# Patient Record
Sex: Female | Born: 1985 | Race: Black or African American | Hispanic: No | Marital: Single | Smoking: Former smoker
Health system: Southern US, Community
[De-identification: ages and names within clinical notes are randomized; demographics above are authoritative.]

## PROBLEM LIST (undated history)

## (undated) DIAGNOSIS — F32A Depression, unspecified: Secondary | ICD-10-CM

## (undated) DIAGNOSIS — G039 Meningitis, unspecified: Secondary | ICD-10-CM

## (undated) DIAGNOSIS — F419 Anxiety disorder, unspecified: Secondary | ICD-10-CM

## (undated) HISTORY — DX: Meningitis, unspecified: G03.9

## (undated) HISTORY — PX: OTHER SURGICAL HISTORY: SHX169

## (undated) HISTORY — PX: COLONOSCOPY: SHX174

## (undated) HISTORY — PX: PARTIAL COLECTOMY: SHX5273

---

## 2010-08-20 ENCOUNTER — Ambulatory Visit: Payer: Self-pay | Admitting: Diagnostic Radiology

## 2010-08-20 ENCOUNTER — Emergency Department (HOSPITAL_BASED_OUTPATIENT_CLINIC_OR_DEPARTMENT_OTHER): Admission: EM | Admit: 2010-08-20 | Discharge: 2010-08-20 | Payer: Self-pay | Admitting: Emergency Medicine

## 2011-01-20 LAB — PREGNANCY, URINE: Preg Test, Ur: NEGATIVE

## 2011-01-20 LAB — URINALYSIS, ROUTINE W REFLEX MICROSCOPIC
Ketones, ur: 15 mg/dL — AB
Nitrite: NEGATIVE
Protein, ur: NEGATIVE mg/dL

## 2012-02-24 ENCOUNTER — Emergency Department (HOSPITAL_BASED_OUTPATIENT_CLINIC_OR_DEPARTMENT_OTHER)
Admission: EM | Admit: 2012-02-24 | Discharge: 2012-02-24 | Disposition: A | Discharge status: Home / Self Care | Payer: Self-pay | Attending: Emergency Medicine | Admitting: Emergency Medicine

## 2012-02-24 ENCOUNTER — Encounter (HOSPITAL_BASED_OUTPATIENT_CLINIC_OR_DEPARTMENT_OTHER): Payer: Self-pay

## 2012-02-24 DIAGNOSIS — H5710 Ocular pain, unspecified eye: Secondary | ICD-10-CM | POA: Insufficient documentation

## 2012-02-24 DIAGNOSIS — H109 Unspecified conjunctivitis: Secondary | ICD-10-CM | POA: Insufficient documentation

## 2012-02-24 MED ORDER — TETRACAINE HCL 0.5 % OP SOLN
1.0000 [drp] | Freq: Once | OPHTHALMIC | Status: AC
  Administered 2012-02-24: 1 [drp] via OPHTHALMIC
  Filled 2012-02-24: qty 2

## 2012-02-24 MED ORDER — SULFACETAMIDE SODIUM 10 % OP OINT: TOPICAL_OINTMENT | OPHTHALMIC | Status: AC

## 2012-02-24 MED ORDER — SULFACETAMIDE SODIUM 10 % OP SOLN
1.0000 [drp] | OPHTHALMIC | Status: DC
  Administered 2012-02-24 (×2): 1 [drp] via OPHTHALMIC
  Filled 2012-02-24: qty 15

## 2012-02-24 MED ORDER — FLUORESCEIN SODIUM 1 MG OP STRP
1.0000 | ORAL_STRIP | Freq: Once | OPHTHALMIC | Status: DC
  Filled 2012-02-24: qty 1

## 2012-02-24 NOTE — ED Notes (Signed)
Pt presents with red conjunctiva in the R eye.  Pt states that she has not injured her eye in anyway, continuously watering

## 2012-02-25 NOTE — ED Provider Notes (Signed)
History     CSN: [id number]  Arrival date & time 02/24/12  2030   First MD Initiated Contact with Patient 02/24/12 2227      Chief Complaint  Patient presents with  . Conjunctivitis    (Consider location/radiation/quality/duration/timing/severity/associated sxs/prior treatment) HPI Comments: Pt is a 26 year old woman who wears contact lenses.  She says that her right eye became very irritated today.  She says she had similar but lesser symptoms last week and had to take her lenses out for a couple of days.  She has right eye pain and excessive tearing.  There has been no fever and no purulent discharge.  Patient is a 26 y.o. female presenting with conjunctivitis. The history is provided by the patient. No language interpreter was used.  Conjunctivitis  The current episode started today. Episode frequency: Had somewhat similar episode last week. The problem has been unchanged. The problem is severe. The symptoms are relieved by nothing. The symptoms are aggravated by nothing. Associated symptoms include photophobia, eye pain and eye redness. Pertinent negatives include no fever. The eye pain is severe. There is pain in the right eye. The eye pain is not associated with movement. The eyelid exhibits no abnormality. She has received no recent medical care.    History reviewed. No pertinent past medical history.  History reviewed. No pertinent past surgical history.  History reviewed. No pertinent family history.  History  Substance Use Topics  . Smoking status: Current Everyday Smoker -- 0.5 packs/day    Types: Cigarettes  . Smokeless tobacco: Not on file  . Alcohol Use: Yes    OB History    Grav Para Term Preterm Abortions TAB SAB Ect Mult Living                  Review of Systems  Constitutional: Negative for fever.  HENT: Negative.   Eyes: Positive for photophobia, pain and redness.  Respiratory: Negative.   Cardiovascular: Negative.   Gastrointestinal: Negative.     Genitourinary: Negative.   Musculoskeletal: Negative.   Skin: Negative.   Neurological: Negative.   Psychiatric/Behavioral: Negative.     Allergies  Review of patient's allergies indicates no known allergies.  Home Medications   Current Outpatient Rx  Name Route Sig Dispense Refill  . ASPIRIN 325 MG PO TABS Oral Take 325 mg by mouth daily. Patient used this medication for her headache as well.    . BC HEADACHE POWDER PO Oral Take 1 packet by mouth daily as needed. Patient used this medication for her headache.    . SULFACETAMIDE SODIUM 10 % OP OINT  Put one drop in right eye every 4 hours while awake for 4 days. 3.5 g 0    BP 127/89  Pulse 89  Temp(Src) 98.9 F (37.2 C) (Oral)  Resp 16  Ht 5\' 5"  (1.651 m)  Wt 150 lb (68.04 kg)  BMI 24.96 kg/m2  SpO2 98%  LMP 02/13/2012  Physical Exam  Nursing note and vitals reviewed. Constitutional: She is oriented to person, place, and time. She appears well-developed and well-nourished.       Resting in darkened room, mild distress complaining of right eye pain and excessive tearing.  HENT:  Head: Normocephalic and atraumatic.  Right Ear: External ear normal.  Left Ear: External ear normal.  Mouth/Throat: Oropharynx is clear and moist.  Eyes: EOM are normal. Pupils are equal, round, and reactive to light.       She has redness of the right  eye bulbar and palpebral conjunctivae.  There is no purulent discharge and no eyelash matting.  On slit lamp exam there is mild fine corneal stippling but no corneal abrasion or ulcer.  Anterior chambers are clear.  Fundus not well seen due to pupillary miosis.  No foreign body seen.    Neck: Normal range of motion. Neck supple.  Lymphadenopathy:    She has no cervical adenopathy.  Neurological: She is alert and oriented to person, place, and time.       No sensory or motor deficit.  Skin: Skin is warm and dry.  Psychiatric: She has a normal mood and affect. Her behavior is normal.    ED  Course  Procedures (including critical care time)  Advised to used Bleph-10 eye drops q4h for the next 4 days, and to leave contact lenses out.  1. Conjunctivitis of right eye        Carleene Cooper III, MD 02/25/12 1250

## 2012-05-20 ENCOUNTER — Emergency Department (HOSPITAL_BASED_OUTPATIENT_CLINIC_OR_DEPARTMENT_OTHER)
Admission: EM | Admit: 2012-05-20 | Discharge: 2012-05-20 | Disposition: A | Discharge status: Home / Self Care | Payer: Self-pay | Attending: Emergency Medicine | Admitting: Emergency Medicine

## 2012-05-20 ENCOUNTER — Emergency Department (HOSPITAL_BASED_OUTPATIENT_CLINIC_OR_DEPARTMENT_OTHER): Payer: Self-pay

## 2012-05-20 ENCOUNTER — Encounter (HOSPITAL_BASED_OUTPATIENT_CLINIC_OR_DEPARTMENT_OTHER): Payer: Self-pay | Admitting: Emergency Medicine

## 2012-05-20 DIAGNOSIS — S8392XA Sprain of unspecified site of left knee, initial encounter: Secondary | ICD-10-CM

## 2012-05-20 DIAGNOSIS — S022XXA Fracture of nasal bones, initial encounter for closed fracture: Secondary | ICD-10-CM | POA: Insufficient documentation

## 2012-05-20 DIAGNOSIS — F17200 Nicotine dependence, unspecified, uncomplicated: Secondary | ICD-10-CM | POA: Insufficient documentation

## 2012-05-20 DIAGNOSIS — IMO0002 Reserved for concepts with insufficient information to code with codable children: Secondary | ICD-10-CM | POA: Insufficient documentation

## 2012-05-20 DIAGNOSIS — Y92009 Unspecified place in unspecified non-institutional (private) residence as the place of occurrence of the external cause: Secondary | ICD-10-CM | POA: Insufficient documentation

## 2012-05-20 MED ORDER — HYDROCODONE-ACETAMINOPHEN 5-500 MG PO TABS: 1.0000 | ORAL_TABLET | Freq: Four times a day (QID) | ORAL | Status: AC | PRN
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
## 2012-08-07 ENCOUNTER — Encounter (HOSPITAL_BASED_OUTPATIENT_CLINIC_OR_DEPARTMENT_OTHER): Payer: Self-pay | Admitting: *Deleted

## 2012-08-07 ENCOUNTER — Emergency Department (HOSPITAL_BASED_OUTPATIENT_CLINIC_OR_DEPARTMENT_OTHER): Payer: Self-pay

## 2012-08-07 ENCOUNTER — Emergency Department (HOSPITAL_BASED_OUTPATIENT_CLINIC_OR_DEPARTMENT_OTHER)
Admission: EM | Admit: 2012-08-07 | Discharge: 2012-08-07 | Disposition: A | Discharge status: Home / Self Care | Payer: Self-pay | Attending: Emergency Medicine | Admitting: Emergency Medicine

## 2012-08-07 DIAGNOSIS — R509 Fever, unspecified: Secondary | ICD-10-CM | POA: Insufficient documentation

## 2012-08-07 DIAGNOSIS — J40 Bronchitis, not specified as acute or chronic: Secondary | ICD-10-CM | POA: Insufficient documentation

## 2012-08-07 MED ORDER — AZITHROMYCIN 250 MG PO TABS: ORAL_TABLET | ORAL | Status: DC

## 2012-08-07 NOTE — ED Notes (Signed)
Fever, chills, diarrhea, aching all over, cough with brown sputum, sore throat and watery eyes. Feels like she has the flu.

## 2012-08-07 NOTE — ED Provider Notes (Signed)
History     CSN: [id number]  Arrival date & time 08/07/12  2029   First MD Initiated Contact with Patient 08/07/12 2110      Chief Complaint  Patient presents with  . Fever    (Consider location/radiation/quality/duration/timing/severity/associated sxs/prior treatment) Patient is a 26 y.o. female presenting with cough. The history is provided by the patient. No language interpreter was used.  Cough This is a new problem. The current episode started more than 2 days ago. The problem occurs constantly. The problem has been gradually worsening. The cough is productive of sputum. The maximum temperature recorded prior to her arrival was 100 to 100.9 F. The fever has been present for 1 to 2 days. Associated symptoms include rhinorrhea and sore throat. The treatment provided moderate relief. Her past medical history does not include pneumonia.   Pt complains of  Feeling like she has the flu.  Pt reports coughing up brown phelgm.   Sinus congestion and pressure History reviewed. No pertinent past medical history.  History reviewed. No pertinent past surgical history.  No family history on file.  History  Substance Use Topics  . Smoking status: Current Every Day Smoker -- 0.5 packs/day    Types: Cigarettes  . Smokeless tobacco: Not on file  . Alcohol Use: Yes     occasional    OB History    Grav Para Term Preterm Abortions TAB SAB Ect Mult Living                  Review of Systems  HENT: Positive for sore throat and rhinorrhea.   Respiratory: Positive for cough.   All other systems reviewed and are negative.    Allergies  Review of patient's allergies indicates no known allergies.  Home Medications   Current Outpatient Rx  Name Route Sig Dispense Refill  . BC HEADACHE POWDER PO Oral Take 1 packet by mouth daily as needed. Patient used this medication for her headache.      BP 111/80  Pulse 88  Temp 98.6 F (37 C) (Oral)  Resp 20  SpO2 99%  Physical Exam    Nursing note and vitals reviewed. Constitutional: She appears well-developed and well-nourished.  HENT:  Head: Normocephalic and atraumatic.  Right Ear: External ear normal.  Left Ear: External ear normal.  Nose: Nose normal.  Mouth/Throat: Oropharynx is clear and moist.  Eyes: Conjunctivae normal and EOM are normal. Pupils are equal, round, and reactive to light.  Neck: Normal range of motion. Neck supple.  Cardiovascular: Normal rate and normal heart sounds.   Pulmonary/Chest: Effort normal.  Abdominal: Soft.  Musculoskeletal: Normal range of motion.  Neurological: She is alert.  Skin: Skin is warm.  Psychiatric: She has a normal mood and affect.    ED Course  Procedures (including critical care time)  Labs Reviewed - No data to display No results found.   1. Bronchitis       MDM  No pneumonia.   I will treat pt for bronchitis.  Pt given rx for zithromax       Elson Areas, [place] 08/07/12 2211  Lonia Skinner [place], [place] 08/07/12 2213

## 2012-08-07 NOTE — ED Notes (Signed)
Pt c/o congestion, sore throat/neck. Denies n/v.

## 2012-08-08 NOTE — ED Provider Notes (Signed)
Medical screening examination/treatment/procedure(s) were performed by non-physician practitioner and as supervising physician I was immediately available for consultation/collaboration.  Charles B. Sheldon, MD 08/08/12 2334 

## 2012-10-30 ENCOUNTER — Inpatient Hospital Stay (HOSPITAL_BASED_OUTPATIENT_CLINIC_OR_DEPARTMENT_OTHER)
Admission: EM | Admit: 2012-10-30 | Discharge: 2012-11-04 | DRG: 075 | Disposition: A | Discharge status: Other Acute Inpatient Hospital | Payer: Self-pay | Attending: Family Medicine | Admitting: Family Medicine

## 2012-10-30 ENCOUNTER — Encounter (HOSPITAL_BASED_OUTPATIENT_CLINIC_OR_DEPARTMENT_OTHER): Payer: Self-pay | Admitting: *Deleted

## 2012-10-30 ENCOUNTER — Emergency Department (HOSPITAL_BASED_OUTPATIENT_CLINIC_OR_DEPARTMENT_OTHER): Payer: Self-pay

## 2012-10-30 DIAGNOSIS — M545 Low back pain, unspecified: Secondary | ICD-10-CM

## 2012-10-30 DIAGNOSIS — R059 Cough, unspecified: Secondary | ICD-10-CM | POA: Diagnosis present

## 2012-10-30 DIAGNOSIS — R509 Fever, unspecified: Secondary | ICD-10-CM | POA: Diagnosis present

## 2012-10-30 DIAGNOSIS — R5381 Other malaise: Secondary | ICD-10-CM | POA: Diagnosis present

## 2012-10-30 DIAGNOSIS — Z3202 Encounter for pregnancy test, result negative: Secondary | ICD-10-CM

## 2012-10-30 DIAGNOSIS — N12 Tubulo-interstitial nephritis, not specified as acute or chronic: Secondary | ICD-10-CM

## 2012-10-30 DIAGNOSIS — A879 Viral meningitis, unspecified: Secondary | ICD-10-CM

## 2012-10-30 DIAGNOSIS — F17200 Nicotine dependence, unspecified, uncomplicated: Secondary | ICD-10-CM | POA: Diagnosis present

## 2012-10-30 DIAGNOSIS — R5383 Other fatigue: Secondary | ICD-10-CM | POA: Diagnosis present

## 2012-10-30 DIAGNOSIS — B003 Herpesviral meningitis: Principal | ICD-10-CM

## 2012-10-30 DIAGNOSIS — N390 Urinary tract infection, site not specified: Secondary | ICD-10-CM

## 2012-10-30 DIAGNOSIS — R05 Cough: Secondary | ICD-10-CM | POA: Diagnosis present

## 2012-10-30 LAB — COMPREHENSIVE METABOLIC PANEL
ALT: 12 U/L (ref 0–35)
AST: 19 U/L (ref 0–37)
Albumin: 4 g/dL (ref 3.5–5.2)
Alkaline Phosphatase: 70 U/L (ref 39–117)
BUN: 8 mg/dL (ref 6–23)
Potassium: 4.2 mEq/L (ref 3.5–5.1)
Sodium: 137 mEq/L (ref 135–145)
Total Protein: 8 g/dL (ref 6.0–8.3)

## 2012-10-30 LAB — CBC WITH DIFFERENTIAL/PLATELET
Basophils Absolute: 0 10*3/uL (ref 0.0–0.1)
Eosinophils Absolute: 0.1 10*3/uL (ref 0.0–0.7)
Lymphocytes Relative: 19 % (ref 12–46)
MCHC: 34.4 g/dL (ref 30.0–36.0)
Monocytes Relative: 5 % (ref 3–12)
Neutrophils Relative %: 75 % (ref 43–77)
Platelets: 362 10*3/uL (ref 150–400)
RDW: 13.3 % (ref 11.5–15.5)
WBC: 11.7 10*3/uL — ABNORMAL HIGH (ref 4.0–10.5)

## 2012-10-30 LAB — URINALYSIS, ROUTINE W REFLEX MICROSCOPIC
Bilirubin Urine: NEGATIVE
Glucose, UA: NEGATIVE mg/dL
Nitrite: NEGATIVE
Specific Gravity, Urine: 1.023 (ref 1.005–1.030)
pH: 7.5 (ref 5.0–8.0)

## 2012-10-30 LAB — URINE MICROSCOPIC-ADD ON

## 2012-10-30 LAB — GLUCOSE, CSF: Glucose, CSF: 72 mg/dL (ref 43–76)

## 2012-10-30 LAB — PREGNANCY, URINE: Preg Test, Ur: NEGATIVE

## 2012-10-30 MED ORDER — METOCLOPRAMIDE HCL 5 MG/ML IJ SOLN
10.0000 mg | Freq: Once | INTRAMUSCULAR | Status: AC
  Administered 2012-10-30: 10 mg via INTRAVENOUS
  Filled 2012-10-30: qty 2

## 2012-10-30 MED ORDER — KETOROLAC TROMETHAMINE 30 MG/ML IJ SOLN
30.0000 mg | Freq: Once | INTRAMUSCULAR | Status: AC
  Administered 2012-10-30: 30 mg via INTRAVENOUS
  Filled 2012-10-30: qty 1

## 2012-10-30 MED ORDER — KETOROLAC TROMETHAMINE 15 MG/ML IJ SOLN
15.0000 mg | Freq: Once | INTRAMUSCULAR | Status: AC
  Administered 2012-10-30: 15 mg via INTRAVENOUS
  Filled 2012-10-30: qty 1

## 2012-10-30 MED ORDER — SODIUM CHLORIDE 0.9 % IV SOLN
Freq: Once | INTRAVENOUS | Status: AC
  Administered 2012-10-30: 21:00:00 via INTRAVENOUS

## 2012-10-30 MED ORDER — SODIUM CHLORIDE 0.9 % IV BOLUS (SEPSIS)
1000.0000 mL | Freq: Once | INTRAVENOUS | Status: AC
  Administered 2012-10-30: 1000 mL via INTRAVENOUS

## 2012-10-30 MED ORDER — DEXAMETHASONE SODIUM PHOSPHATE 10 MG/ML IJ SOLN
10.0000 mg | Freq: Once | INTRAMUSCULAR | Status: AC
  Administered 2012-10-30: 10 mg via INTRAVENOUS
  Filled 2012-10-30: qty 1

## 2012-10-30 MED ORDER — MIDAZOLAM HCL 2 MG/2ML IJ SOLN
2.0000 mg | Freq: Once | INTRAMUSCULAR | Status: AC
  Administered 2012-10-30: 2 mg via INTRAVENOUS
  Filled 2012-10-30: qty 2
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
## 2012-10-30 NOTE — ED Notes (Signed)
To ED via EMS c.o headache, neck and back pain. Cough and fever. Looks like she does not feels good.

## 2012-10-30 NOTE — ED Notes (Signed)
Patient back from CT.

## 2012-10-30 NOTE — ED Notes (Signed)
Headache, neck and back pain. Fever, runny nose and chills.

## 2012-10-30 NOTE — ED Notes (Signed)
Patient transported to CT 

## 2012-10-30 NOTE — ED Notes (Signed)
Pts mother states she is feeling much worse.

## 2012-10-31 ENCOUNTER — Encounter (HOSPITAL_COMMUNITY): Payer: Self-pay | Admitting: General Practice

## 2012-10-31 DIAGNOSIS — A879 Viral meningitis, unspecified: Secondary | ICD-10-CM

## 2012-10-31 DIAGNOSIS — R51 Headache: Secondary | ICD-10-CM

## 2012-10-31 DIAGNOSIS — N390 Urinary tract infection, site not specified: Secondary | ICD-10-CM | POA: Diagnosis present

## 2012-10-31 DIAGNOSIS — N12 Tubulo-interstitial nephritis, not specified as acute or chronic: Secondary | ICD-10-CM | POA: Diagnosis present

## 2012-10-31 LAB — CBC
HCT: 37.4 % (ref 36.0–46.0)
Hemoglobin: 12.7 g/dL (ref 12.0–15.0)
MCH: 28.3 pg (ref 26.0–34.0)
MCHC: 34 g/dL (ref 30.0–36.0)
MCV: 83.3 fL (ref 78.0–100.0)
Platelets: 329 10*3/uL (ref 150–400)
RBC: 4.49 MIL/uL (ref 3.87–5.11)
RDW: 13 % (ref 11.5–15.5)
WBC: 9.9 10*3/uL (ref 4.0–10.5)

## 2012-10-31 LAB — CSF CELL COUNT WITH DIFFERENTIAL
Monocyte-Macrophage-Spinal Fluid: 2 % — ABNORMAL LOW (ref 15–45)
Segmented Neutrophils-CSF: 1 % (ref 0–6)
WBC, CSF: 554 /mm3 (ref 0–5)

## 2012-10-31 LAB — CREATININE, SERUM: Creatinine, Ser: 0.57 mg/dL (ref 0.50–1.10)

## 2012-10-31 MED ORDER — LORAZEPAM 2 MG/ML IJ SOLN
1.0000 mg | Freq: Once | INTRAMUSCULAR | Status: AC
  Administered 2012-10-31: 1 mg via INTRAVENOUS
  Filled 2012-10-31: qty 1

## 2012-10-31 MED ORDER — FENTANYL CITRATE 0.05 MG/ML IJ SOLN
50.0000 ug | Freq: Once | INTRAMUSCULAR | Status: AC
  Administered 2012-10-31: 50 ug via INTRAVENOUS
  Filled 2012-10-31: qty 2

## 2012-10-31 MED ORDER — VANCOMYCIN HCL 1000 MG IV SOLR
750.0000 mg | Freq: Three times a day (TID) | INTRAVENOUS | Status: DC
  Administered 2012-10-31: 750 mg via INTRAVENOUS
  Filled 2012-10-31 (×3): qty 750

## 2012-10-31 MED ORDER — SODIUM CHLORIDE 0.9 % IV SOLN: INTRAVENOUS | Status: DC

## 2012-10-31 MED ORDER — DEXTROSE 5 % IV SOLN
2.0000 g | Freq: Once | INTRAVENOUS | Status: AC
  Administered 2012-10-31: 2 g via INTRAVENOUS
  Filled 2012-10-31: qty 2

## 2012-10-31 MED ORDER — HEPARIN SODIUM (PORCINE) 5000 UNIT/ML IJ SOLN
5000.0000 [IU] | Freq: Three times a day (TID) | INTRAMUSCULAR | Status: DC
  Administered 2012-10-31 – 2012-11-03 (×12): 5000 [IU] via SUBCUTANEOUS
  Filled 2012-10-31 (×17): qty 1

## 2012-10-31 MED ORDER — MORPHINE SULFATE 2 MG/ML IJ SOLN
1.0000 mg | INTRAMUSCULAR | Status: DC | PRN
  Administered 2012-10-31 – 2012-11-03 (×9): 2 mg via INTRAVENOUS
  Filled 2012-10-31 (×10): qty 1

## 2012-10-31 MED ORDER — DEXTROSE 5 % IV SOLN
2.0000 g | Freq: Two times a day (BID) | INTRAVENOUS | Status: DC
  Administered 2012-10-31 – 2012-11-01 (×3): 2 g via INTRAVENOUS
  Filled 2012-10-31 (×3): qty 2

## 2012-10-31 MED ORDER — DEXTROSE 5 % IV SOLN
10.0000 mg/kg | Freq: Three times a day (TID) | INTRAVENOUS | Status: DC
  Administered 2012-10-31 – 2012-11-03 (×9): 660 mg via INTRAVENOUS
  Filled 2012-10-31 (×12): qty 13.2

## 2012-10-31 MED ORDER — ONDANSETRON HCL 4 MG/2ML IJ SOLN: 4.0000 mg | Freq: Three times a day (TID) | INTRAMUSCULAR | Status: AC | PRN

## 2012-10-31 MED ORDER — DEXAMETHASONE SODIUM PHOSPHATE 10 MG/ML IJ SOLN
10.0000 mg | Freq: Once | INTRAMUSCULAR | Status: AC
  Administered 2012-10-31: 10 mg via INTRAVENOUS
  Filled 2012-10-31: qty 1

## 2012-10-31 MED ORDER — DEXAMETHASONE SODIUM PHOSPHATE 10 MG/ML IJ SOLN
10.0000 mg | Freq: Four times a day (QID) | INTRAMUSCULAR | Status: DC
  Administered 2012-10-31 – 2012-11-01 (×5): 10 mg via INTRAVENOUS
  Filled 2012-10-31 (×8): qty 1

## 2012-10-31 MED ORDER — SODIUM CHLORIDE 0.9 % IJ SOLN: 3.0000 mL | Freq: Two times a day (BID) | INTRAMUSCULAR | Status: DC

## 2012-10-31 MED ORDER — HYDROMORPHONE HCL PF 1 MG/ML IJ SOLN
0.5000 mg | INTRAMUSCULAR | Status: DC | PRN
  Administered 2012-10-31: 1 mg via INTRAVENOUS
  Filled 2012-10-31: qty 1

## 2012-10-31 MED ORDER — OXYCODONE HCL 5 MG PO TABS
5.0000 mg | ORAL_TABLET | Freq: Four times a day (QID) | ORAL | Status: DC | PRN
  Administered 2012-10-31 – 2012-11-03 (×3): 5 mg via ORAL
  Filled 2012-10-31 (×4): qty 1

## 2012-10-31 MED ORDER — KETOROLAC TROMETHAMINE 30 MG/ML IJ SOLN
30.0000 mg | Freq: Four times a day (QID) | INTRAMUSCULAR | Status: DC | PRN
  Administered 2012-11-02 – 2012-11-03 (×3): 30 mg via INTRAVENOUS
  Filled 2012-10-31 (×5): qty 1

## 2012-10-31 MED ORDER — SODIUM CHLORIDE 0.9 % IV SOLN: 250.0000 mL | INTRAVENOUS | Status: DC | PRN

## 2012-10-31 MED ORDER — SODIUM CHLORIDE 0.9 % IV SOLN
INTRAVENOUS | Status: AC
  Administered 2012-10-31: 03:00:00 via INTRAVENOUS

## 2012-10-31 MED ORDER — SODIUM CHLORIDE 0.9 % IJ SOLN
3.0000 mL | INTRAMUSCULAR | Status: DC | PRN
  Administered 2012-11-02: 3 mL via INTRAVENOUS

## 2012-10-31 MED ORDER — DEXTROSE 5 % IV SOLN
2.0000 g | INTRAVENOUS | Status: DC
  Filled 2012-10-31: qty 2

## 2012-10-31 MED ORDER — SODIUM CHLORIDE 0.9 % IJ SOLN
3.0000 mL | Freq: Two times a day (BID) | INTRAMUSCULAR | Status: DC
  Administered 2012-10-31 – 2012-11-04 (×7): 3 mL via INTRAVENOUS

## 2012-10-31 NOTE — ED Notes (Signed)
Called patient's mother, Sylvia Garcia & informed her of her daughter's transfer to [facility] and Room number. Mother's contact number [phone].

## 2012-10-31 NOTE — Consult Note (Signed)
INFECTIOUS DISEASE CONSULT NOTE  Date of Admission:  10/30/2012  Date of Consult:  10/31/2012  Reason for Consult: Meningitis Referring Physician: Thompson  Impression/Recommendation Meningitis UTI  Would- Check HSV PCR and enterovirus PCR on CSF as you have.  D/c isolation. Continue Acyclovir while test pending. Await UCx, CSF Cx.  Check HIV. Check influenza pcr.  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 We are out of season for tick borne illnesses as well as arboviruses. HSV seems most likely, influenza is certainly in the running.   Thank you so much for this interesting consult,   Johny Sax [phone]  Sylvia Garcia is an 26 y.o. female.  HPI: 26 yo F with no PMHX admitted today with 5 days of URI sx, body aches and headaches. She came to ED 10-30-12 after her headache worsened and she also developed visual disturbances. She underwent LP showing WBC 554 (97% L), RBC 69, Protein 103, Glc 72. She was started on empiric anbx, steroids, acyclovir. She has also been noted to have UA with 7-10 WBC and bacteria.  Since admission and LP she has felt better. Decreased headache. Still has significant lower back pain.   History reviewed. No pertinent past medical history.  History reviewed. No pertinent past surgical history.   No Known Allergies  Medications:  Scheduled:   . acyclovir  10 mg/kg Intravenous Q8H  . cefTRIAXone (ROCEPHIN)  IV  2 g Intravenous Q12H  . dexamethasone  10 mg Intravenous Q6H  . heparin  5,000 Units Subcutaneous Q8H  . sodium chloride  3 mL Intravenous Q12H  . vancomycin  750 mg Intravenous Q8H    Total days of antibiotics 1 (ceftriaxone/vanco/acyclovir)          Social History:  reports that she has been smoking Cigarettes.  She has been smoking about .5 packs per day. She does not have any smokeless tobacco history on file. She reports that she drinks alcohol. She reports that she does not use illicit  drugs.  No family history on file.  Negative except none General ROS: no oral ulvers or sores, ? L neck LN, has had DOE, weakness in her LE, no neuropathy, normal BM, normal urination, denies sick contacts, see HPI.   Blood pressure 116/69, pulse 87, temperature 98.2 F (36.8 C), temperature source Oral, resp. rate 17, height 5\' 5"  (1.651 m), weight 65.817 kg (145 lb 1.6 oz), last menstrual period 10/02/2012, SpO2 95.00%. General appearance: alert, cooperative and no distress Eyes: negative findings: pupils equal, round, reactive to light and accomodation and no photophbia. eye coloring contact lenses Throat: lips, mucosa, and tongue normal; teeth and gums normal Neck: no adenopathy, supple, symmetrical, trachea midline and non-tender. FROM Lungs: clear to auscultation bilaterally Heart: regular rate and rhythm Abdomen: normal findings: bowel sounds normal and soft, non-tender Extremities: edema none Lymph nodes: Cervical adenopathy: none Neurologic: Grossly normal mild tenderness of lumbar spine. no fluctuance noted.    Results for orders placed during the hospital encounter of 10/30/12 (from the past 48 hour(s))  CBC WITH DIFFERENTIAL     Status: Abnormal   Collection Time   10/30/12  6:05 PM      Component Value Range Comment   WBC 11.7 (*) 4.0 - 10.5 K/uL    RBC 4.62  3.87 - 5.11 MIL/uL    Hemoglobin 13.5  12.0 - 15.0 g/dL    HCT 45.4  09.8 - 11.9 %    MCV 85.1  78.0 - 100.0 fL  MCH 29.2  26.0 - 34.0 pg    MCHC 34.4  30.0 - 36.0 g/dL    RDW 16.1  09.6 - 04.5 %    Platelets 362  150 - 400 K/uL    Neutrophils Relative 75  43 - 77 %    Lymphocytes Relative 19  12 - 46 %    Monocytes Relative 5  3 - 12 %    Eosinophils Relative 1  0 - 5 %    Basophils Relative 0  0 - 1 %    Neutro Abs 8.8 (*) 1.7 - 7.7 K/uL    Lymphs Abs 2.2  0.7 - 4.0 K/uL    Monocytes Absolute 0.6  0.1 - 1.0 K/uL    Eosinophils Absolute 0.1  0.0 - 0.7 K/uL    Basophils Absolute 0.0  0.0 - 0.1 K/uL     Smear Review MORPHOLOGY UNREMARKABLE     COMPREHENSIVE METABOLIC PANEL     Status: Abnormal   Collection Time   10/30/12  6:05 PM      Component Value Range Comment   Sodium 137  135 - 145 mEq/L    Potassium 4.2  3.5 - 5.1 mEq/L    Chloride 99  96 - 112 mEq/L    CO2 27  19 - 32 mEq/L    Glucose, Bld 103 (*) 70 - 99 mg/dL    BUN 8  6 - 23 mg/dL    Creatinine, Ser 4.09  0.50 - 1.10 mg/dL    Calcium 9.3  8.4 - 81.1 mg/dL    Total Protein 8.0  6.0 - 8.3 g/dL    Albumin 4.0  3.5 - 5.2 g/dL    AST 19  0 - 37 U/L    ALT 12  0 - 35 U/L    Alkaline Phosphatase 70  39 - 117 U/L    Total Bilirubin 0.1 (*) 0.3 - 1.2 mg/dL    GFR calc non Af Amer >90  >90 mL/min    GFR calc Af Amer >90  >90 mL/min   PREGNANCY, URINE     Status: Normal   Collection Time   10/30/12  7:02 PM      Component Value Range Comment   Preg Test, Ur NEGATIVE  NEGATIVE   URINALYSIS, ROUTINE W REFLEX MICROSCOPIC     Status: Abnormal   Collection Time   10/30/12  7:02 PM      Component Value Range Comment   Color, Urine YELLOW  YELLOW    APPearance CLOUDY (*) CLEAR    Specific Gravity, Urine 1.023  1.005 - 1.030    pH 7.5  5.0 - 8.0    Glucose, UA NEGATIVE  NEGATIVE mg/dL    Hgb urine dipstick NEGATIVE  NEGATIVE    Bilirubin Urine NEGATIVE  NEGATIVE    Ketones, ur NEGATIVE  NEGATIVE mg/dL    Protein, ur NEGATIVE  NEGATIVE mg/dL    Urobilinogen, UA 0.2  0.0 - 1.0 mg/dL    Nitrite NEGATIVE  NEGATIVE    Leukocytes, UA MODERATE (*) NEGATIVE   URINE MICROSCOPIC-ADD ON     Status: Abnormal   Collection Time   10/30/12  7:02 PM      Component Value Range Comment   Squamous Epithelial / LPF MANY (*) RARE    WBC, UA 7-10  <3 WBC/hpf    Bacteria, UA FEW (*) RARE   CSF CELL COUNT WITH DIFFERENTIAL     Status: Abnormal   Collection Time  10/30/12 10:00 PM      Component Value Range Comment   Tube # please run cell count with diff in tube 1 and 4      Color, CSF COLORLESS  COLORLESS    Appearance, CSF HAZY (*) CLEAR     Supernatant NOT INDICATED      RBC Count, CSF 310 (*) 0 /cu mm    WBC, CSF 402 (*) 0 - 5 /cu mm    Segmented Neutrophils-CSF 2  0 - 6 %    Lymphs, CSF 96 (*) 40 - 80 %    Monocyte-Macrophage-Spinal Fluid 2 (*) 15 - 45 %   CSF CULTURE     Status: Normal (Preliminary result)   Collection Time   10/30/12 10:00 PM      Component Value Range Comment   Specimen Description CSF      Special Requests Normal      Gram Stain        Value: WBC PRESENT,BOTH PMN AND MONONUCLEAR     NO ORGANISMS SEEN   Culture PENDING      Report Status PENDING     GLUCOSE, CSF     Status: Normal   Collection Time   10/30/12 10:00 PM      Component Value Range Comment   Glucose, CSF 72  43 - 76 mg/dL   PROTEIN, CSF     Status: Abnormal   Collection Time   10/30/12 10:00 PM      Component Value Range Comment   Total  Protein, CSF 103 (*) 15 - 45 mg/dL   CSF CELL COUNT WITH DIFFERENTIAL     Status: Abnormal   Collection Time   10/30/12 10:00 PM      Component Value Range Comment   Tube # 4      Color, CSF COLORLESS  COLORLESS    Appearance, CSF HAZY (*) CLEAR    Supernatant NOT INDICATED      RBC Count, CSF 69 (*) 0 /cu mm    WBC, CSF 554 (*) 0 - 5 /cu mm    Segmented Neutrophils-CSF 1  0 - 6 %    Lymphs, CSF 97 (*) 40 - 80 %    Monocyte-Macrophage-Spinal Fluid 2 (*) 15 - 45 %   CBC     Status: Normal   Collection Time   10/31/12  8:43 AM      Component Value Range Comment   WBC 9.9  4.0 - 10.5 K/uL    RBC 4.49  3.87 - 5.11 MIL/uL    Hemoglobin 12.7  12.0 - 15.0 g/dL    HCT 78.4  69.6 - 29.5 %    MCV 83.3  78.0 - 100.0 fL    MCH 28.3  26.0 - 34.0 pg    MCHC 34.0  30.0 - 36.0 g/dL    RDW 28.4  13.2 - 44.0 %    Platelets 329  150 - 400 K/uL   CREATININE, SERUM     Status: Normal   Collection Time   10/31/12  8:43 AM      Component Value Range Comment   Creatinine, Ser 0.57  0.50 - 1.10 mg/dL    GFR calc non Af Amer >90  >90 mL/min    GFR calc Af Amer >90  >90 mL/min       Component  Value Date/Time   SDES CSF 10/30/2012 2200   SPECREQUEST Normal 10/30/2012 2200   CULT PENDING 10/30/2012 2200   REPTSTATUS  PENDING 10/30/2012 2200   Dg Chest 2 View  10/30/2012  *RADIOLOGY REPORT*  Clinical Data: 26 year old female headache, neck and back pain. Fever, runny nose.  CHEST - 2 VIEW  Comparison: 08/07/2012.  Findings: Normal lung volumes. Normal cardiac size and mediastinal contours.  Visualized tracheal air column is within normal limits. Lung parenchyma is stable and clear.  No pneumothorax or effusion. No osseous abnormality identified.  IMPRESSION: Stable and negative, no acute cardiopulmonary abnormality.   Original Report Authenticated By: Erskine Speed, M.D.    Ct Head Wo Contrast  10/30/2012  *RADIOLOGY REPORT*  Clinical Data: Headache, neck and back pain, cough and fever  CT HEAD WITHOUT CONTRAST  Technique:  Contiguous axial images were obtained from the base of the skull through the vertex without contrast.  Comparison: None.  Findings: The ventricular system is normal in size and configuration, and the septum is in a normal midline position.  The fourth ventricle and basilar cisterns appear normal.  No hemorrhage, mass lesion, or acute infarction is seen.  On bone window images, no calvarial abnormality is noted. There is mild mucosal thickening within the sphenoid sinus but no air-fluid level is seen.  IMPRESSION:  1.  No acute intracranial abnormality. 2.  Some mucosal thickening in the sphenoid sinus.  However, no air- fluid level is seen.   Original Report Authenticated By: Dwyane Dee, M.D.    Recent Results (from the past 240 hour(s))  CSF CULTURE     Status: Normal (Preliminary result)   Collection Time   10/30/12 10:00 PM      Component Value Range Status Comment   Specimen Description CSF   Final    Special Requests Normal   Final    Gram Stain     Final    Value: WBC PRESENT,BOTH PMN AND MONONUCLEAR     NO ORGANISMS SEEN   Culture PENDING   Incomplete     Report Status PENDING   Incomplete       10/31/2012, 3:39 PM     LOS: 1 day

## 2012-10-31 NOTE — ED Notes (Signed)
Report given to Shanda Bumps, RN [facility] Room [phone].

## 2012-10-31 NOTE — ED Provider Notes (Addendum)
History     CSN: [id number]  Arrival date & time 10/30/12  1513   First MD Initiated Contact with Patient 10/30/12 1754      Chief Complaint  Patient presents with  . Headache    (Consider location/radiation/quality/duration/timing/severity/associated sxs/prior treatment) HPI Comments: Young woman with no medical hx comes in with cc of headaches. Pt started getting sick y'day, and started having a low grade fever with a throbbing, bilateral frontal headache. The headache is pretty severe, and has been severe since within minutes of onset. It is described as constant pain, worse with lights. She has some nausea, no emesis, and is also having some visual disturbance -described as spots. She has no neck stiffness, but occasionally does have pain radiate down her neck to her lower back. Pt has a cough, productive. No myalgias, but she does feel sluggish.    Patient is a 26 y.o. female presenting with headaches. The history is provided by the patient.  Headache  Associated symptoms include a fever. Pertinent negatives include no shortness of breath, no nausea and no vomiting.    History reviewed. No pertinent past medical history.  History reviewed. No pertinent past surgical history.  No family history on file.  History  Substance Use Topics  . Smoking status: Current Every Day Smoker -- 0.5 packs/day    Types: Cigarettes  . Smokeless tobacco: Not on file  . Alcohol Use: Yes     Comment: occasional    OB History    Grav Para Term Preterm Abortions TAB SAB Ect Mult Living                  Review of Systems  Constitutional: Positive for fever, activity change and fatigue. Negative for chills.  HENT: Negative for congestion, facial swelling, rhinorrhea and neck pain.   Respiratory: Positive for cough. Negative for shortness of breath and wheezing.   Cardiovascular: Negative for chest pain.  Gastrointestinal: Negative for nausea, vomiting, abdominal pain, diarrhea,  constipation, blood in stool and abdominal distention.  Genitourinary: Negative for hematuria and difficulty urinating.  Skin: Negative for color change.  Neurological: Positive for headaches. Negative for dizziness, seizures, syncope and speech difficulty.  Hematological: Does not bruise/bleed easily.  Psychiatric/Behavioral: Negative for confusion.    Allergies  Review of patient's allergies indicates no known allergies.  Home Medications   Current Outpatient Rx  Name  Route  Sig  Dispense  Refill  . BC HEADACHE POWDER PO   Oral   Take 1 packet by mouth daily as needed. Patient used this medication for her headache.         . AZITHROMYCIN 250 MG PO TABS      2 tablets 1st day and then 1 tablet a day   6 tablet   0     BP 105/61  Pulse 81  Temp 99.8 F (37.7 C) (Oral)  Resp 18  SpO2 97%  LMP 10/02/2012  Physical Exam  Nursing note and vitals reviewed. Constitutional: She is oriented to person, place, and time. She appears well-developed and well-nourished.  HENT:  Head: Normocephalic and atraumatic.  Eyes: EOM are normal. Pupils are equal, round, and reactive to light.  Neck: Neck supple.  Cardiovascular: Normal rate, regular rhythm and normal heart sounds.   No murmur heard. Pulmonary/Chest: Effort normal. No respiratory distress.  Abdominal: Soft. She exhibits no distension. There is no tenderness. There is no rebound and no guarding.  Neurological: She is alert and oriented to person, place,  and time.       Cerebellar exam is normal (finger to nose) Sensory exam normal for bilateral upper and lower extremities - and patient is able to discriminate between sharp and dull. Motor exam is 4+/5   Skin: Skin is warm and dry.    ED Course  LUMBAR PUNCTURE Date/Time: 10/31/2012 12:11 AM Performed by: Derwood Kaplan Authorized by: Derwood Kaplan Consent: Written consent obtained. Risks and benefits: risks, benefits and alternatives were discussed Consent  given by: patient Patient understanding: patient states understanding of the procedure being performed Site marked: the operative site was marked Patient identity confirmed: verbally with patient Indications: evaluation for infection Anesthesia: local infiltration Local anesthetic: lidocaine 2% without epinephrine Anesthetic total: 3 ml Patient sedated: yes Sedation type: anxiolysis and moderate (conscious) sedation Sedatives: midazolam Sedation start date/time: 10/30/2012 10:00 PM Sedation end date/time: 10/30/2012 10:30 PM Vitals: Vital signs were monitored during sedation. Preparation: Patient was prepped and draped in the usual sterile fashion. Lumbar space: L4-L5 interspace Patient's position: right lateral decubitus Needle gauge: 20 Needle type: spinal needle - Quincke tip Needle length: 3.5 in Number of attempts: 2 Fluid appearance: clear Tubes of fluid: 4 Total volume: 15 ml Post-procedure: site cleaned and adhesive bandage applied Patient tolerance: Patient tolerated the procedure well with no immediate complications.   (including critical care time)  Labs Reviewed  CBC WITH DIFFERENTIAL - Abnormal; Notable for the following:    WBC 11.7 (*)     Neutro Abs 8.8 (*)     All other components within normal limits  COMPREHENSIVE METABOLIC PANEL - Abnormal; Notable for the following:    Glucose, Bld 103 (*)     Total Bilirubin 0.1 (*)     All other components within normal limits  URINALYSIS, ROUTINE W REFLEX MICROSCOPIC - Abnormal; Notable for the following:    APPearance CLOUDY (*)     Leukocytes, UA MODERATE (*)     All other components within normal limits  URINE MICROSCOPIC-ADD ON - Abnormal; Notable for the following:    Squamous Epithelial / LPF MANY (*)     Bacteria, UA FEW (*)     All other components within normal limits  PROTEIN, CSF - Abnormal; Notable for the following:    Total  Protein, CSF 103 (*)     All other components within normal limits   PREGNANCY, URINE  GLUCOSE, CSF  URINE CULTURE  CSF CELL COUNT WITH DIFFERENTIAL  CSF CULTURE  GRAM STAIN  CSF CELL COUNT WITH DIFFERENTIAL   Dg Chest 2 View  10/30/2012  *RADIOLOGY REPORT*  Clinical Data: 26 year old female headache, neck and back pain. Fever, runny nose.  CHEST - 2 VIEW  Comparison: 08/07/2012.  Findings: Normal lung volumes. Normal cardiac size and mediastinal contours.  Visualized tracheal air column is within normal limits. Lung parenchyma is stable and clear.  No pneumothorax or effusion. No osseous abnormality identified.  IMPRESSION: Stable and negative, no acute cardiopulmonary abnormality.   Original Report Authenticated By: Erskine Speed, M.D.    Ct Head Wo Contrast  10/30/2012  *RADIOLOGY REPORT*  Clinical Data: Headache, neck and back pain, cough and fever  CT HEAD WITHOUT CONTRAST  Technique:  Contiguous axial images were obtained from the base of the skull through the vertex without contrast.  Comparison: None.  Findings: The ventricular system is normal in size and configuration, and the septum is in a normal midline position.  The fourth ventricle and basilar cisterns appear normal.  No hemorrhage, mass lesion,  or acute infarction is seen.  On bone window images, no calvarial abnormality is noted. There is mild mucosal thickening within the sphenoid sinus but no air-fluid level is seen.  IMPRESSION:  1.  No acute intracranial abnormality. 2.  Some mucosal thickening in the sphenoid sinus.  However, no air- fluid level is seen.   Original Report Authenticated By: Dwyane Dee, M.D.      No diagnosis found.    MDM  DDX includes: Primary headaches - including migrainous headaches, cluster headaches, tension headaches. ICH Carotid dissection Cavernous sinus thrombosis Meningitis Encephalitis Sinusitis Tumor Vascular headaches AV malformation Brain aneurysm Muscular headaches  A/P: Pt comes in with cc of headaches. Headaches started y'day, and are  pretty severe. She has family hx of brain cancer. Pt also has been having mild fevers. Her neuro exam is normal. Besides nausea, fever, no other red flags per hx or exam. We will get CT head, hydrate and reassess for LP. I dont suspect SAH, CT would be to r/o infection.  LATE ENTRY: Tolerated LP well. Results pending from CSF analysis. Pt is sleeping. Headaches improved. Still a little sluggish. Mother at bedside and i went over the plan.  If CSF shows no infection - pt to be discharged as viral syndrome. Otherwise, patient will be admitted.  Derwood Kaplan, MD 10/31/12 [phone]

## 2012-10-31 NOTE — ED Provider Notes (Signed)
Nursing notes and vitals signs, including pulse oximetry, reviewed.  Summary of this visit's results, reviewed by myself:  Labs:  Results for orders placed during the hospital encounter of 10/30/12 (from the past 24 hour(s))  CBC WITH DIFFERENTIAL     Status: Abnormal   Collection Time   10/30/12  6:05 PM      Component Value Range   WBC 11.7 (*) 4.0 - 10.5 K/uL   RBC 4.62  3.87 - 5.11 MIL/uL   Hemoglobin 13.5  12.0 - 15.0 g/dL   HCT 96.0  45.4 - 09.8 %   MCV 85.1  78.0 - 100.0 fL   MCH 29.2  26.0 - 34.0 pg   MCHC 34.4  30.0 - 36.0 g/dL   RDW 11.9  14.7 - 82.9 %   Platelets 362  150 - 400 K/uL   Neutrophils Relative 75  43 - 77 %   Lymphocytes Relative 19  12 - 46 %   Monocytes Relative 5  3 - 12 %   Eosinophils Relative 1  0 - 5 %   Basophils Relative 0  0 - 1 %   Neutro Abs 8.8 (*) 1.7 - 7.7 K/uL   Lymphs Abs 2.2  0.7 - 4.0 K/uL   Monocytes Absolute 0.6  0.1 - 1.0 K/uL   Eosinophils Absolute 0.1  0.0 - 0.7 K/uL   Basophils Absolute 0.0  0.0 - 0.1 K/uL   Smear Review MORPHOLOGY UNREMARKABLE    COMPREHENSIVE METABOLIC PANEL     Status: Abnormal   Collection Time   10/30/12  6:05 PM      Component Value Range   Sodium 137  135 - 145 mEq/L   Potassium 4.2  3.5 - 5.1 mEq/L   Chloride 99  96 - 112 mEq/L   CO2 27  19 - 32 mEq/L   Glucose, Bld 103 (*) 70 - 99 mg/dL   BUN 8  6 - 23 mg/dL   Creatinine, Ser 5.62  0.50 - 1.10 mg/dL   Calcium 9.3  8.4 - 13.0 mg/dL   Total Protein 8.0  6.0 - 8.3 g/dL   Albumin 4.0  3.5 - 5.2 g/dL   AST 19  0 - 37 U/L   ALT 12  0 - 35 U/L   Alkaline Phosphatase 70  39 - 117 U/L   Total Bilirubin 0.1 (*) 0.3 - 1.2 mg/dL   GFR calc non Af Amer >90  >90 mL/min   GFR calc Af Amer >90  >90 mL/min  PREGNANCY, URINE     Status: Normal   Collection Time   10/30/12  7:02 PM      Component Value Range   Preg Test, Ur NEGATIVE  NEGATIVE  URINALYSIS, ROUTINE W REFLEX MICROSCOPIC     Status: Abnormal   Collection Time   10/30/12  7:02 PM   Component Value Range   Color, Urine YELLOW  YELLOW   APPearance CLOUDY (*) CLEAR   Specific Gravity, Urine 1.023  1.005 - 1.030   pH 7.5  5.0 - 8.0   Glucose, UA NEGATIVE  NEGATIVE mg/dL   Hgb urine dipstick NEGATIVE  NEGATIVE   Bilirubin Urine NEGATIVE  NEGATIVE   Ketones, ur NEGATIVE  NEGATIVE mg/dL   Protein, ur NEGATIVE  NEGATIVE mg/dL   Urobilinogen, UA 0.2  0.0 - 1.0 mg/dL   Nitrite NEGATIVE  NEGATIVE   Leukocytes, UA MODERATE (*) NEGATIVE  URINE MICROSCOPIC-ADD ON     Status: Abnormal  Collection Time   10/30/12  7:02 PM      Component Value Range   Squamous Epithelial / LPF MANY (*) RARE   WBC, UA 7-10  <3 WBC/hpf   Bacteria, UA FEW (*) RARE  CSF CELL COUNT WITH DIFFERENTIAL     Status: Abnormal   Collection Time   10/30/12 10:00 PM      Component Value Range   Tube # please run cell count with diff in tube 1 and 4     Color, CSF COLORLESS  COLORLESS   Appearance, CSF HAZY (*) CLEAR   Supernatant NOT INDICATED     RBC Count, CSF 310 (*) 0 /cu mm   WBC, CSF 402 (*) 0 - 5 /cu mm   Segmented Neutrophils-CSF 2  0 - 6 %   Lymphs, CSF 96 (*) 40 - 80 %   Monocyte-Macrophage-Spinal Fluid 2 (*) 15 - 45 %  CSF CULTURE     Status: Normal (Preliminary result)   Collection Time   10/30/12 10:00 PM      Component Value Range   Specimen Description CSF     Special Requests Normal     Gram Stain       Value: WBC PRESENT,BOTH PMN AND MONONUCLEAR     NO ORGANISMS SEEN   Culture PENDING     Report Status PENDING    GLUCOSE, CSF     Status: Normal   Collection Time   10/30/12 10:00 PM      Component Value Range   Glucose, CSF 72  43 - 76 mg/dL  PROTEIN, CSF     Status: Abnormal   Collection Time   10/30/12 10:00 PM      Component Value Range   Total  Protein, CSF 103 (*) 15 - 45 mg/dL  CSF CELL COUNT WITH DIFFERENTIAL     Status: Abnormal   Collection Time   10/30/12 10:00 PM      Component Value Range   Tube # 4     Color, CSF COLORLESS  COLORLESS   Appearance, CSF  HAZY (*) CLEAR   Supernatant NOT INDICATED     RBC Count, CSF 69 (*) 0 /cu mm   WBC, CSF 554 (*) 0 - 5 /cu mm   Segmented Neutrophils-CSF 1  0 - 6 %   Lymphs, CSF 97 (*) 40 - 80 %   Monocyte-Macrophage-Spinal Fluid 2 (*) 15 - 45 %    Imaging Studies: Dg Chest 2 View  10/30/2012  *RADIOLOGY REPORT*  Clinical Data: 26 year old female headache, neck and back pain. Fever, runny nose.  CHEST - 2 VIEW  Comparison: 08/07/2012.  Findings: Normal lung volumes. Normal cardiac size and mediastinal contours.  Visualized tracheal air column is within normal limits. Lung parenchyma is stable and clear.  No pneumothorax or effusion. No osseous abnormality identified.  IMPRESSION: Stable and negative, no acute cardiopulmonary abnormality.   Original Report Authenticated By: Erskine Speed, M.D.    Ct Head Wo Contrast  10/30/2012  *RADIOLOGY REPORT*  Clinical Data: Headache, neck and back pain, cough and fever  CT HEAD WITHOUT CONTRAST  Technique:  Contiguous axial images were obtained from the base of the skull through the vertex without contrast.  Comparison: None.  Findings: The ventricular system is normal in size and configuration, and the septum is in a normal midline position.  The fourth ventricle and basilar cisterns appear normal.  No hemorrhage, mass lesion, or acute infarction is seen.  On bone  window images, no calvarial abnormality is noted. There is mild mucosal thickening within the sphenoid sinus but no air-fluid level is seen.  IMPRESSION:  1.  No acute intracranial abnormality. 2.  Some mucosal thickening in the sphenoid sinus.  However, no air- fluid level is seen.   Original Report Authenticated By: Dwyane Dee, M.D.       Hanley Seamen, MD 10/31/12 0130

## 2012-10-31 NOTE — Progress Notes (Signed)
Patient arrived to unit.  Patient oriented to the unit and call bell placed within reach.  MD notified of patient's arrival.

## 2012-10-31 NOTE — Plan of Care (Signed)
Problem: Phase I Progression Outcomes Goal: Initial discharge plan identified Outcome: Completed/Met Date Met:  10/31/12 Discharge to home

## 2012-10-31 NOTE — ED Notes (Signed)
Report given to [place], [employer]. ETA 15-64min.

## 2012-10-31 NOTE — Progress Notes (Signed)
Patient's heart rhythm shows bigeminy, PVCs, PACs, and skipped beats on the monitor.  MD notified.  RN will continue to monitor the patient.

## 2012-10-31 NOTE — Progress Notes (Signed)
[facility] PROGRESS NOTE  Sylvia Garcia ZOX:[id number] DOB: Jun 18, 1986 DOA: 10/30/2012 PCP: No primary provider on file.  Assessment/Plan:  #1 meningitis. Probably viral. Patient presented with headache nuchal rigidity or visual disturbance worrisome for meningitis. Patient is status post lumbar puncture in the ED. LP results with 554 WBCs with one segmented neutrophil and 97 lymphocytes. Cultures are pending. Patient with some clinical improvement. Will place patient empirically on IV Rocephin, IV vancomycin, IV acyclovir, IV Decadron while cultures are pending. Continue supportive care. We'll consult with infectious disease for further evaluation and recommendations.  #2 urinary tract infection Urine cultures are pending. Will place empirically on IV Rocephin.  #3 probable pyelonephritis Urinalysis worrisome for UTI. Patient with left CVA tenderness. We'll place empirically on IV Rocephin while urine cultures are pending. Follow. Pain management.  #4 prophylaxis Heparin for DVT prophylaxis.   Code Status: Full Family Communication: Updated patient. No family present. Disposition Plan: Home when medically stable.   Consultants:  Infectious diseases: Dr. Ninetta Lights pending  Procedures:  Lumbar puncture 10/30/2012 per ED physician  Antibiotics:  IV vancomycin 10/31/2012  IV Rocephin 10/31/2012  IV acyclovir 10/31/2012  HPI/Subjective: Patient states headache is improving. Patient complaining of left lower back pain. Patient neck stiffness is improving.  Objective: Filed Vitals:   10/30/12 2356 10/31/12 0146 10/31/12 0247 10/31/12 0434  BP: 105/61 102/63 106/62 122/73  Pulse: 81 82 82 83  Temp:  99 F (37.2 C) 98.2 F (36.8 C) 98.5 F (36.9 C)  TempSrc:   Oral Oral  Resp: 18 17 17 16   Height:    5\' 5"  (1.651 m)  Weight:    65.817 kg (145 lb 1.6 oz)  SpO2: 97% 100% 100% 100%    Intake/Output Summary (Last 24 hours) at 10/31/12 1046 Last data filed at  10/31/12 0700  Gross per 24 hour  Intake   1020 ml  Output      0 ml  Net   1020 ml   Filed Weights   10/31/12 0434  Weight: 65.817 kg (145 lb 1.6 oz)    Exam:   General:  NAD  Cardiovascular: RRR no murmurs rubs or gallops, no JVD, no lower extremity edema,  Respiratory: Clear to auscultation bilaterally. No wheezes no crackles no rhonchi  Abdomen: Soft, nontender, nondistended, positive bowel sounds. Left CVA tenderness  Data Reviewed: Basic Metabolic Panel:  Lab 10/31/12 8119 10/30/12 1805  NA -- 137  K -- 4.2  CL -- 99  CO2 -- 27  GLUCOSE -- 103*  BUN -- 8  CREATININE 0.57 0.80  CALCIUM -- 9.3  MG -- --  PHOS -- --   Liver Function Tests:  Lab 10/30/12 1805  AST 19  ALT 12  ALKPHOS 70  BILITOT 0.1*  PROT 8.0  ALBUMIN 4.0   No results found for this basename: LIPASE:5,AMYLASE:5 in the last 168 hours No results found for this basename: AMMONIA:5 in the last 168 hours CBC:  Lab 10/31/12 0843 10/30/12 1805  WBC 9.9 11.7*  NEUTROABS -- 8.8*  HGB 12.7 13.5  HCT 37.4 39.3  MCV 83.3 85.1  PLT 329 362   Cardiac Enzymes: No results found for this basename: CKTOTAL:5,CKMB:5,CKMBINDEX:5,TROPONINI:5 in the last 168 hours BNP (last 3 results) No results found for this basename: PROBNP:3 in the last 8760 hours CBG: No results found for this basename: GLUCAP:5 in the last 168 hours  Recent Results (from the past 240 hour(s))  CSF CULTURE     Status: Normal (Preliminary result)  Collection Time   10/30/12 10:00 PM      Component Value Range Status Comment   Specimen Description CSF   Final    Special Requests Normal   Final    Gram Stain     Final    Value: WBC PRESENT,BOTH PMN AND MONONUCLEAR     NO ORGANISMS SEEN   Culture PENDING   Incomplete    Report Status PENDING   Incomplete      Studies: Dg Chest 2 View  10/30/2012  *RADIOLOGY REPORT*  Clinical Data: 26 year old female headache, neck and back pain. Fever, runny nose.  CHEST - 2 VIEW   Comparison: 08/07/2012.  Findings: Normal lung volumes. Normal cardiac size and mediastinal contours.  Visualized tracheal air column is within normal limits. Lung parenchyma is stable and clear.  No pneumothorax or effusion. No osseous abnormality identified.  IMPRESSION: Stable and negative, no acute cardiopulmonary abnormality.   Original Report Authenticated By: Erskine Speed, M.D.    Ct Head Wo Contrast  10/30/2012  *RADIOLOGY REPORT*  Clinical Data: Headache, neck and back pain, cough and fever  CT HEAD WITHOUT CONTRAST  Technique:  Contiguous axial images were obtained from the base of the skull through the vertex without contrast.  Comparison: None.  Findings: The ventricular system is normal in size and configuration, and the septum is in a normal midline position.  The fourth ventricle and basilar cisterns appear normal.  No hemorrhage, mass lesion, or acute infarction is seen.  On bone window images, no calvarial abnormality is noted. There is mild mucosal thickening within the sphenoid sinus but no air-fluid level is seen.  IMPRESSION:  1.  No acute intracranial abnormality. 2.  Some mucosal thickening in the sphenoid sinus.  However, no air- fluid level is seen.   Original Report Authenticated By: Dwyane Dee, M.D.     Scheduled Meds:   . sodium chloride   Intravenous STAT  . heparin  5,000 Units Subcutaneous Q8H  . sodium chloride  3 mL Intravenous Q12H   Continuous Infusions:   Principal Problem:  *Viral meningitis Active Problems:  UTI (lower urinary tract infection)  Pyelonephritis    Time spent: > 35 mins    [facility]  [facility] Pager [phone]. If 8PM-8AM, please contact night-coverage at [email or web address], password [facility] 10/31/2012, 10:46 AM  LOS: 1 day

## 2012-10-31 NOTE — H&P (Signed)
[facility] History and Physical  Vicky Schleich EAV:[id number] DOB: 1985/11/27 DOA: 10/30/2012  Referring physician: ED PCP: No primary provider on file.  Specialists: None  Chief Complaint: Headache  HPI: Sylvia Garcia is a 26 y.o. female who presents with c/o headache.  Onset yesterday though she has been feeling ill with generalized muscle aches, fatigue, and URI symptoms since Friday.  Yesterday her headache got significantly worse and began to be associated with visual disturbance (spots) and new nuchal rigidity.  In the ED patient was found to have nuchal rigidity, be running a mild temp of 99.8, and have a WBC of 11.7.  Empiric steroids and 2gm rocephin were started and dosed once.  A spinal tap was performed which demonstrated WBC count of 554 with 69 reds, total protein was 103, glucose 72, and 97% of the cells were lymphocytes.  A presumptive diagnosis of viral meningitis has been made and hospitalist asked to admit the patient.  Review of Systems: the patient has not gotten her influenza shot this year, she works at a [employer] school, no recent vaginal lesions or cold sores on mouth concerning for HSV, 12 systems reviewed and otherwise negative.  History reviewed. No pertinent past medical history. Previously healthy. History reviewed. No pertinent past surgical history. Social History:  reports that she has been smoking Cigarettes.  She has been smoking about .5 packs per day. She does not have any smokeless tobacco history on file. She reports that she drinks alcohol. She reports that she does not use illicit drugs.  No Known Allergies  No family history on file. No one in family has been ill recently.  Prior to Admission medications   Medication Sig Start Date End Date Taking? Authorizing Provider  Aspirin-Salicylamide-Caffeine (BC HEADACHE POWDER PO) Take 1 packet by mouth daily as needed. Patient used this medication for her headache.    Historical Provider, MD   azithromycin (ZITHROMAX Z-PAK) 250 MG tablet 2 tablets 1st day and then 1 tablet a day 08/07/12   Elson Areas, [place]   Physical Exam: Filed Vitals:   10/30/12 2356 10/31/12 0146 10/31/12 0247 10/31/12 0434  BP: 105/61 102/63 106/62 122/73  Pulse: 81 82 82 83  Temp:  99 F (37.2 C) 98.2 F (36.8 C) 98.5 F (36.9 C)  TempSrc:   Oral Oral  Resp: 18 17 17 16   Height:    5\' 5"  (1.651 m)  Weight:    65.817 kg (145 lb 1.6 oz)  SpO2: 97% 100% 100% 100%     General:  NAD, resting comfortably in bed  Eyes: PEERLA EOMI  ENT: mucous membranes moist  Neck: positive nuchal rigidity w/o JVD  Cardiovascular: RRR w/o MRG  Respiratory: CTA B  Abdomen: soft, nt, nd, bs+  Skin: no rash nor lesion  Musculoskeletal: MAE, positive straight leg raise test bilaterally  Psychiatric: normal tone and affect  Neurologic: AAOx3, grossly non-focal   Labs on Admission:  Basic Metabolic Panel:  Lab 10/30/12 7829  NA 137  K 4.2  CL 99  CO2 27  GLUCOSE 103*  BUN 8  CREATININE 0.80  CALCIUM 9.3  MG --  PHOS --   Liver Function Tests:  Lab 10/30/12 1805  AST 19  ALT 12  ALKPHOS 70  BILITOT 0.1*  PROT 8.0  ALBUMIN 4.0   No results found for this basename: LIPASE:5,AMYLASE:5 in the last 168 hours No results found for this basename: AMMONIA:5 in the last 168 hours CBC:  Lab 10/30/12 1805  WBC 11.7*  NEUTROABS 8.8*  HGB 13.5  HCT 39.3  MCV 85.1  PLT 362   Cardiac Enzymes: No results found for this basename: CKTOTAL:5,CKMB:5,CKMBINDEX:5,TROPONINI:5 in the last 168 hours  BNP (last 3 results) No results found for this basename: PROBNP:3 in the last 8760 hours CBG: No results found for this basename: GLUCAP:5 in the last 168 hours  Radiological Exams on Admission: Dg Chest 2 View  10/30/2012  *RADIOLOGY REPORT*  Clinical Data: 26 year old female headache, neck and back pain. Fever, runny nose.  CHEST - 2 VIEW  Comparison: 08/07/2012.  Findings: Normal lung volumes.  Normal cardiac size and mediastinal contours.  Visualized tracheal air column is within normal limits. Lung parenchyma is stable and clear.  No pneumothorax or effusion. No osseous abnormality identified.  IMPRESSION: Stable and negative, no acute cardiopulmonary abnormality.   Original Report Authenticated By: Erskine Speed, M.D.    Ct Head Wo Contrast  10/30/2012  *RADIOLOGY REPORT*  Clinical Data: Headache, neck and back pain, cough and fever  CT HEAD WITHOUT CONTRAST  Technique:  Contiguous axial images were obtained from the base of the skull through the vertex without contrast.  Comparison: None.  Findings: The ventricular system is normal in size and configuration, and the septum is in a normal midline position.  The fourth ventricle and basilar cisterns appear normal.  No hemorrhage, mass lesion, or acute infarction is seen.  On bone window images, no calvarial abnormality is noted. There is mild mucosal thickening within the sphenoid sinus but no air-fluid level is seen.  IMPRESSION:  1.  No acute intracranial abnormality. 2.  Some mucosal thickening in the sphenoid sinus.  However, no air- fluid level is seen.   Original Report Authenticated By: Dwyane Dee, M.D.     EKG: Independently reviewed.  Assessment/Plan Principal Problem:  *Viral meningitis   1. Viral meningitis - the patients URI prodrome that onset Friday before turning into a viral meningitis symptoms on Monday when combined with the CSF findings of  WBC count of 554 with 69 reds, total protein was 103, glucose 72, and 97% of the cells were lymphocytes is highly suggestive of viral meningitis.  The patient has no recent history of lesions concerning for HSV that would make me think that this is likely to be an HSV infection so not really any indication to start acyclovir at this time.  Will hold off on ABx therapy for now given that this is likely not bacterial meningitis.  Tick borne illness such as RMSF is considered and the  patient does have a dog at home, but is felt to be less likely as she has no known tick bites for certain, has no fever, and it is currently winter time (wrong season).  Enterovirus is quite possible despite the season.  Enterovirus and HSV PCRs are pending at this time.    Code Status: Full Code (must indicate code status--if unknown or must be presumed, indicate so) Family Communication: No family in room (indicate person spoken with, if applicable, with phone number if by telephone) Disposition Plan: Admit to inpatient (indicate anticipated LOS)  Time spent: 70 min  GARDNER, JARED M. [facility] Pager [phone]  If 7PM-7AM, please contact night-coverage [email or web address] Password [facility] 10/31/2012, 6:15 AM

## 2012-10-31 NOTE — Progress Notes (Signed)
  ANTIBIOTIC CONSULT NOTE - INITIAL  Pharmacy Consult for Vancomycin, acyclovir Indication: meningitis  No Known Allergies  Patient Measurements: Height: 5\' 5"  (165.1 cm) Weight: 145 lb 1.6 oz (65.817 kg) (SCALE b) IBW/kg (Calculated) : 57   Vital Signs: Temp: 98.5 F (36.9 C) (12/25 0434) Temp src: Oral (12/25 0434) BP: 122/73 mmHg (12/25 0434) Pulse Rate: 83  (12/25 0434) Intake/Output from previous day: 12/24 0701 - 12/25 0700 In: 1020 [I.V.:1020] Out: -  Intake/Output from this shift: Total I/O In: 360 [P.O.:360] Out: 500 [Urine:500]  Labs:  [facility] 10/31/12 0843 10/30/12 1805  WBC 9.9 11.7*  HGB 12.7 13.5  PLT 329 362  LABCREA -- --  CREATININE 0.57 0.80   Estimated Creatinine Clearance: 94.2 ml/min (by C-G formula based on Cr of 0.57). No results found for this basename: VANCOTROUGH:2,VANCOPEAK:2,VANCORANDOM:2,GENTTROUGH:2,GENTPEAK:2,GENTRANDOM:2,TOBRATROUGH:2,TOBRAPEAK:2,TOBRARND:2,AMIKACINPEAK:2,AMIKACINTROU:2,AMIKACIN:2, in the last 72 hours   Microbiology: Recent Results (from the past 720 hour(s))  CSF CULTURE     Status: Normal (Preliminary result)   Collection Time   10/30/12 10:00 PM      Component Value Range Status Comment   Specimen Description CSF   Final    Special Requests Normal   Final    Gram Stain     Final    Value: WBC PRESENT,BOTH PMN AND MONONUCLEAR     NO ORGANISMS SEEN   Culture PENDING   Incomplete    Report Status PENDING   Incomplete     Medical History: History reviewed. No pertinent past medical history.  Medications:  Scheduled:    . [COMPLETED] sodium chloride   Intravenous Once  . sodium chloride   Intravenous STAT  . [COMPLETED] cefTRIAXone (ROCEPHIN)  IV  2 g Intravenous Once  . cefTRIAXone (ROCEPHIN)  IV  2 g Intravenous Q12H  . [COMPLETED] dexamethasone  10 mg Intravenous Once  . [COMPLETED] dexamethasone  10 mg Intravenous Once  . dexamethasone  10 mg Intravenous Q6H  . [COMPLETED] fentaNYL  50 mcg  Intravenous Once  . heparin  5,000 Units Subcutaneous Q8H  . [COMPLETED] ketorolac  15 mg Intravenous Once  . [COMPLETED] ketorolac  30 mg Intravenous Once  . [COMPLETED] metoCLOPramide (REGLAN) injection  10 mg Intravenous Once  . [COMPLETED] metoCLOPramide (REGLAN) injection  10 mg Intravenous Once  . [COMPLETED] midazolam  2 mg Intravenous Once  . [COMPLETED] sodium chloride  1,000 mL Intravenous Once  . sodium chloride  3 mL Intravenous Q12H  . [DISCONTINUED] cefTRIAXone (ROCEPHIN)  IV  2 g Intravenous Q24H  . [DISCONTINUED] sodium chloride  3 mL Intravenous Q12H   Assessment: 26 yo female who presented to the ED with headache, neck and back pain. Patient is to receive vancomycin, ceftriaxone, acyclovir and IV steroids due to concern for meningitis. CSF cultures pending. Cr 0.57, CrCl ~95. WBC 9.9. Ht 5'5", Wt 65.8 kg.   Goal of Therapy:  Vancomycin trough level 15-20 mcg/ml  Plan:  - vancomycin 750 mg q8hrs - acyclovir 10 mg/kg q8hrs - ceftriaxone changed to 2g q12hrs  Lillia Pauls, PharmD Clinical Pharmacist Pager: [phone] Phone: [phone] 10/31/2012 11:47 AM

## 2012-11-01 DIAGNOSIS — A879 Viral meningitis, unspecified: Secondary | ICD-10-CM

## 2012-11-01 LAB — CBC
HCT: 35.9 % — ABNORMAL LOW (ref 36.0–46.0)
MCV: 83.5 fL (ref 78.0–100.0)
Platelets: 351 10*3/uL (ref 150–400)
RBC: 4.3 MIL/uL (ref 3.87–5.11)
WBC: 20.9 10*3/uL — ABNORMAL HIGH (ref 4.0–10.5)

## 2012-11-01 LAB — BASIC METABOLIC PANEL
CO2: 23 mEq/L (ref 19–32)
Chloride: 107 mEq/L (ref 96–112)
Creatinine, Ser: 0.62 mg/dL (ref 0.50–1.10)

## 2012-11-01 LAB — URINE CULTURE

## 2012-11-01 LAB — DIFFERENTIAL
Eosinophils Absolute: 0 10*3/uL (ref 0.0–0.7)
Lymphs Abs: 2.2 10*3/uL (ref 0.7–4.0)
Monocytes Relative: 2 % — ABNORMAL LOW (ref 3–12)
Neutrophils Relative %: 88 % — ABNORMAL HIGH (ref 43–77)

## 2012-11-01 LAB — PATHOLOGIST SMEAR REVIEW: Path Review: INCREASED

## 2012-11-01 LAB — INFLUENZA PANEL BY PCR (TYPE A & B): Influenza A By PCR: NEGATIVE

## 2012-11-01 LAB — HERPES SIMPLEX VIRUS(HSV) DNA BY PCR

## 2012-11-01 MED ORDER — DEXTROSE 5 % IV SOLN
1.0000 g | INTRAVENOUS | Status: DC
  Administered 2012-11-02: 1 g via INTRAVENOUS
  Filled 2012-11-01: qty 10

## 2012-11-01 NOTE — Progress Notes (Signed)
Utilization Review Completed.   Nayib Remer, RN, BSN Nurse Case Manager  [phone]  

## 2012-11-01 NOTE — Progress Notes (Signed)
[facility] PROGRESS NOTE  Malary Aylesworth ZOX:[id number] DOB: [birth year] DOA: 10/30/2012 PCP: No primary provider on file.  Assessment/Plan:  #1 meningitis. Probably viral. Patient presented with headache nuchal rigidity or visual disturbance worrisome for meningitis. Patient is status post lumbar puncture in the ED. LP results with 554 WBCs with one segmented neutrophil and 97 lymphocytes. Cultures are pending. Patient with clinical improvement. HIV negative. Influenza panel negative. CSF cultures were no growth to date. HSV PCR is pending. Vancomycin has been discontinued. Decadron has been discontinued per ID. Continue empiric IV Rocephin and acyclovir. Supportive care.   #2 urinary tract infection Urine cultures with no growth. Continue IV Rocephin.  #3 probable pyelonephritis Urinalysis worrisome for UTI. Patient with left CVA tenderness. Urine cultures were no growth. Clinical improvement. Continue IV Rocephin. Follow.   #4 prophylaxis Heparin for DVT prophylaxis.   Code Status: Full Family Communication: Updated patient and mother at bedside Disposition Plan: Home when medically stable.   Consultants:  Infectious diseases: Dr. Ninetta Lights 10/31/2012  Procedures:  Lumbar puncture 10/30/2012 per ED physician  Antibiotics:  IV vancomycin 10/31/2012 >>> 11/01/2012  IV Rocephin 10/31/2012  IV acyclovir 10/31/2012  HPI/Subjective: Patient states she feels much better today. Neck pain has improved. Patient denies any headaches. Patient states back pain is improved.  Objective: Filed Vitals:   10/31/12 2213 11/01/12 0536 11/01/12 1010 11/01/12 1247  BP: 112/63 102/59 115/68 121/71  Pulse: 85 88 110 95  Temp: 98.5 F (36.9 C) 98.3 F (36.8 C)  97.3 F (36.3 C)  TempSrc: Oral Oral  Oral  Resp: 18 16 16 18   Height:      Weight:  64.819 kg (142 lb 14.4 oz)    SpO2: 97% 99% 100% 97%    Intake/Output Summary (Last 24 hours) at 11/01/12 1345 Last data filed at  11/01/12 1246  Gross per 24 hour  Intake    582 ml  Output   4700 ml  Net  -4118 ml   Filed Weights   10/31/12 0434 11/01/12 0536  Weight: 65.817 kg (145 lb 1.6 oz) 64.819 kg (142 lb 14.4 oz)    Exam:   General:  NAD  Cardiovascular: RRR no murmurs rubs or gallops, no JVD, no lower extremity edema,  Respiratory: Clear to auscultation bilaterally. No wheezes no crackles no rhonchi  Abdomen: Soft, nontender, nondistended, positive bowel sounds. Left CVA tenderness  Data Reviewed: Basic Metabolic Panel:  Lab 11/01/12 8119 10/31/12 0843 10/30/12 1805  NA 141 -- 137  K 3.6 -- 4.2  CL 107 -- 99  CO2 23 -- 27  GLUCOSE 137* -- 103*  BUN 8 -- 8  CREATININE 0.62 0.57 0.80  CALCIUM 9.2 -- 9.3  MG -- -- --  PHOS -- -- --   Liver Function Tests:  Lab 10/30/12 1805  AST 19  ALT 12  ALKPHOS 70  BILITOT 0.1*  PROT 8.0  ALBUMIN 4.0   No results found for this basename: LIPASE:5,AMYLASE:5 in the last 168 hours No results found for this basename: AMMONIA:5 in the last 168 hours CBC:  Lab 11/01/12 0545 10/31/12 0843 10/30/12 1805  WBC 20.9* 9.9 11.7*  NEUTROABS 18.4* -- 8.8*  HGB 12.2 12.7 13.5  HCT 35.9* 37.4 39.3  MCV 83.5 83.3 85.1  PLT 351 329 362   Cardiac Enzymes: No results found for this basename: CKTOTAL:5,CKMB:5,CKMBINDEX:5,TROPONINI:5 in the last 168 hours BNP (last 3 results) No results found for this basename: PROBNP:3 in the last 8760 hours CBG: No results found  for this basename: GLUCAP:5 in the last 168 hours  Recent Results (from the past 240 hour(s))  URINE CULTURE     Status: Normal   Collection Time   10/30/12  7:02 PM      Component Value Range Status Comment   Specimen Description URINE, CLEAN CATCH   Final    Special Requests NONE   Final    Culture  Setup Time 10/31/2012 02:29   Final    Colony Count NO GROWTH   Final    Culture NO GROWTH   Final    Report Status 11/01/2012 FINAL   Final   CSF CULTURE     Status: Normal (Preliminary  result)   Collection Time   10/30/12 10:00 PM      Component Value Range Status Comment   Specimen Description CSF   Final    Special Requests Normal   Final    Gram Stain     Final    Value: WBC PRESENT,BOTH PMN AND MONONUCLEAR     NO ORGANISMS SEEN   Culture NO GROWTH 1 DAY   Final    Report Status PENDING   Incomplete      Studies: Dg Chest 2 View  10/30/2012  *RADIOLOGY REPORT*  Clinical Data: 26 year old female headache, neck and back pain. Fever, runny nose.  CHEST - 2 VIEW  Comparison: 08/07/2012.  Findings: Normal lung volumes. Normal cardiac size and mediastinal contours.  Visualized tracheal air column is within normal limits. Lung parenchyma is stable and clear.  No pneumothorax or effusion. No osseous abnormality identified.  IMPRESSION: Stable and negative, no acute cardiopulmonary abnormality.   Original Report Authenticated By: Erskine Speed, M.D.    Ct Head Wo Contrast  10/30/2012  *RADIOLOGY REPORT*  Clinical Data: Headache, neck and back pain, cough and fever  CT HEAD WITHOUT CONTRAST  Technique:  Contiguous axial images were obtained from the base of the skull through the vertex without contrast.  Comparison: None.  Findings: The ventricular system is normal in size and configuration, and the septum is in a normal midline position.  The fourth ventricle and basilar cisterns appear normal.  No hemorrhage, mass lesion, or acute infarction is seen.  On bone window images, no calvarial abnormality is noted. There is mild mucosal thickening within the sphenoid sinus but no air-fluid level is seen.  IMPRESSION:  1.  No acute intracranial abnormality. 2.  Some mucosal thickening in the sphenoid sinus.  However, no air- fluid level is seen.   Original Report Authenticated By: Dwyane Dee, M.D.     Scheduled Meds:    . acyclovir  10 mg/kg Intravenous Q8H  . cefTRIAXone (ROCEPHIN)  IV  2 g Intravenous Q12H  . dexamethasone  10 mg Intravenous Q6H  . heparin  5,000 Units Subcutaneous  Q8H  . sodium chloride  3 mL Intravenous Q12H   Continuous Infusions:    . sodium chloride 125 mL/hr at 10/31/12 1213    Principal Problem:  *Viral meningitis Active Problems:  UTI (lower urinary tract infection)  Pyelonephritis    Time spent: > 35 mins    [facility]  [facility] Pager [phone]. If 8PM-8AM, please contact night-coverage at [email or web address], password [facility] 11/01/2012, 1:45 PM  LOS: 2 days

## 2012-11-01 NOTE — Progress Notes (Signed)
INFECTIOUS DISEASE PROGRESS NOTE  ID: Sylvia Garcia is a 26 y.o. female with   Principal Problem:  *Viral meningitis Active Problems:  UTI (lower urinary tract infection)  Pyelonephritis  Subjective: Still having back pain. No neck pain.  Abtx:  Anti-infectives     Start     Dose/Rate Route Frequency Ordered Stop   10/31/12 1300   vancomycin (VANCOCIN) 750 mg in sodium chloride 0.9 % 150 mL IVPB  Status:  Discontinued        750 mg 150 mL/hr over 60 Minutes Intravenous Every 8 hours 10/31/12 1154 10/31/12 1629   10/31/12 1300   acyclovir (ZOVIRAX) 660 mg in dextrose 5 % 100 mL IVPB        10 mg/kg  65.8 kg 113.2 mL/hr over 60 Minutes Intravenous 3 times per day 10/31/12 1154     10/31/12 1200   cefTRIAXone (ROCEPHIN) 2 g in dextrose 5 % 50 mL IVPB  Status:  Discontinued        2 g 100 mL/hr over 30 Minutes Intravenous Every 24 hours 10/31/12 1047 10/31/12 1136   10/31/12 1200   cefTRIAXone (ROCEPHIN) 2 g in dextrose 5 % 50 mL IVPB        2 g 100 mL/hr over 30 Minutes Intravenous Every 12 hours 10/31/12 1136     10/31/12 0045   cefTRIAXone (ROCEPHIN) 2 g in dextrose 5 % 50 mL IVPB        2 g 100 mL/hr over 30 Minutes Intravenous  Once 10/31/12 0041 10/31/12 0138          Medications:  Scheduled:   . acyclovir  10 mg/kg Intravenous Q8H  . cefTRIAXone (ROCEPHIN)  IV  2 g Intravenous Q12H  . heparin  5,000 Units Subcutaneous Q8H  . sodium chloride  3 mL Intravenous Q12H    Objective: Vital signs in last 24 hours: Temp:  [97.3 F (36.3 C)-98.5 F (36.9 C)] 97.3 F (36.3 C) (12/26 1247) Pulse Rate:  [85-110] 95  (12/26 1247) Resp:  [16-18] 18  (12/26 1247) BP: (102-121)/(59-71) 121/71 mmHg (12/26 1247) SpO2:  [95 %-100 %] 97 % (12/26 1247) Weight:  [64.819 kg (142 lb 14.4 oz)] 64.819 kg (142 lb 14.4 oz) (12/26 0536)   Neck: FROM Resp: clear to auscultation bilaterally Cardio: regular rate and rhythm GI: normal findings: bowel sounds normal and soft,  non-tender Extremities: no edema  Lab Results  Basename 11/01/12 0545 10/31/12 0843 10/30/12 1805  WBC 20.9* 9.9 --  HGB 12.2 12.7 --  HCT 35.9* 37.4 --  NA 141 -- 137  K 3.6 -- 4.2  CL 107 -- 99  CO2 23 -- 27  BUN 8 -- 8  CREATININE 0.62 0.57 --  GLU -- -- --   Liver Panel  Basename 10/30/12 1805  PROT 8.0  ALBUMIN 4.0  AST 19  ALT 12  ALKPHOS 70  BILITOT 0.1*  BILIDIR --  IBILI --   Sedimentation Rate No results found for this basename: ESRSEDRATE in the last 72 hours C-Reactive Protein No results found for this basename: CRP:2 in the last 72 hours  Microbiology: Recent Results (from the past 240 hour(s))  URINE CULTURE     Status: Normal   Collection Time   10/30/12  7:02 PM      Component Value Range Status Comment   Specimen Description URINE, CLEAN CATCH   Final    Special Requests NONE   Final    Culture  Setup Time  10/31/2012 02:29   Final    Colony Count NO GROWTH   Final    Culture NO GROWTH   Final    Report Status 11/01/2012 FINAL   Final   CSF CULTURE     Status: Normal (Preliminary result)   Collection Time   10/30/12 10:00 PM      Component Value Range Status Comment   Specimen Description CSF   Final    Special Requests Normal   Final    Gram Stain     Final    Value: WBC PRESENT,BOTH PMN AND MONONUCLEAR     NO ORGANISMS SEEN   Culture NO GROWTH 1 DAY   Final    Report Status PENDING   Incomplete     Studies/Results: Dg Chest 2 View  10/30/2012  *RADIOLOGY REPORT*  Clinical Data: 26 year old female headache, neck and back pain. Fever, runny nose.  CHEST - 2 VIEW  Comparison: 08/07/2012.  Findings: Normal lung volumes. Normal cardiac size and mediastinal contours.  Visualized tracheal air column is within normal limits. Lung parenchyma is stable and clear.  No pneumothorax or effusion. No osseous abnormality identified.  IMPRESSION: Stable and negative, no acute cardiopulmonary abnormality.   Original Report Authenticated By: Erskine Speed,  M.D.    Ct Head Wo Contrast  10/30/2012  *RADIOLOGY REPORT*  Clinical Data: Headache, neck and back pain, cough and fever  CT HEAD WITHOUT CONTRAST  Technique:  Contiguous axial images were obtained from the base of the skull through the vertex without contrast.  Comparison: None.  Findings: The ventricular system is normal in size and configuration, and the septum is in a normal midline position.  The fourth ventricle and basilar cisterns appear normal.  No hemorrhage, mass lesion, or acute infarction is seen.  On bone window images, no calvarial abnormality is noted. There is mild mucosal thickening within the sphenoid sinus but no air-fluid level is seen.  IMPRESSION:  1.  No acute intracranial abnormality. 2.  Some mucosal thickening in the sphenoid sinus.  However, no air- fluid level is seen.   Original Report Authenticated By: Dwyane Dee, M.D.      Assessment/Plan: UTI  Meningitis Total days of antibiotics 2 (acyclovir, ceftrriaxone) HIV (-), Influenza (-) UCx (-), CSF Cx (ngtd) Await HSV PCR If she continues to have back pain consider MRI spine? If she otherwise does well, consider changer to PO in Am and home.  Will change ceftriaxone to UTI dose, stop steroid (cause of leukocytosis) Explained to pt and family         Johny Sax Infectious Diseases [phone] 11/01/2012, 1:47 PM   LOS: 2 days

## 2012-11-02 ENCOUNTER — Inpatient Hospital Stay (HOSPITAL_COMMUNITY): Payer: MEDICAID

## 2012-11-02 DIAGNOSIS — M533 Sacrococcygeal disorders, not elsewhere classified: Secondary | ICD-10-CM

## 2012-11-02 DIAGNOSIS — B003 Herpesviral meningitis: Secondary | ICD-10-CM | POA: Diagnosis present

## 2012-11-02 DIAGNOSIS — M545 Low back pain: Secondary | ICD-10-CM

## 2012-11-02 LAB — CBC
Hemoglobin: 9.9 g/dL — ABNORMAL LOW (ref 12.0–15.0)
MCH: 28.4 pg (ref 26.0–34.0)
RBC: 3.48 MIL/uL — ABNORMAL LOW (ref 3.87–5.11)
WBC: 18.1 10*3/uL — ABNORMAL HIGH (ref 4.0–10.5)

## 2012-11-02 LAB — BASIC METABOLIC PANEL
CO2: 24 mEq/L (ref 19–32)
Chloride: 106 mEq/L (ref 96–112)
Glucose, Bld: 90 mg/dL (ref 70–99)
Potassium: 3.5 mEq/L (ref 3.5–5.1)
Sodium: 140 mEq/L (ref 135–145)

## 2012-11-02 MED ORDER — CIPROFLOXACIN HCL 500 MG PO TABS
500.0000 mg | ORAL_TABLET | Freq: Two times a day (BID) | ORAL | Status: DC
  Administered 2012-11-02 – 2012-11-04 (×4): 500 mg via ORAL
  Filled 2012-11-02 (×6): qty 1

## 2012-11-02 NOTE — Progress Notes (Signed)
INFECTIOUS DISEASE PROGRESS NOTE  ID: Sylvia Garcia is a 26 y.o. female with  Principal Problem:  *Viral meningitis Active Problems:  UTI (lower urinary tract infection)  Pyelonephritis  Subjective: Feels better, no headaches, still some lower back pain which has moved lower in her spine  Abtx:  Anti-infectives     Start     Dose/Rate Route Frequency Ordered Stop   11/02/12 1156   cefTRIAXone (ROCEPHIN) 1 g in dextrose 5 % 50 mL IVPB        1 g 100 mL/hr over 30 Minutes Intravenous Every 24 hours 11/01/12 1357     10/31/12 1300   vancomycin (VANCOCIN) 750 mg in sodium chloride 0.9 % 150 mL IVPB  Status:  Discontinued        750 mg 150 mL/hr over 60 Minutes Intravenous Every 8 hours 10/31/12 1154 10/31/12 1629   10/31/12 1300   acyclovir (ZOVIRAX) 660 mg in dextrose 5 % 100 mL IVPB        10 mg/kg  65.8 kg 113.2 mL/hr over 60 Minutes Intravenous 3 times per day 10/31/12 1154     10/31/12 1200   cefTRIAXone (ROCEPHIN) 2 g in dextrose 5 % 50 mL IVPB  Status:  Discontinued        2 g 100 mL/hr over 30 Minutes Intravenous Every 24 hours 10/31/12 1047 10/31/12 1136   10/31/12 1200   cefTRIAXone (ROCEPHIN) 2 g in dextrose 5 % 50 mL IVPB  Status:  Discontinued        2 g 100 mL/hr over 30 Minutes Intravenous Every 12 hours 10/31/12 1136 11/01/12 1357   10/31/12 0045   cefTRIAXone (ROCEPHIN) 2 g in dextrose 5 % 50 mL IVPB        2 g 100 mL/hr over 30 Minutes Intravenous  Once 10/31/12 0041 10/31/12 0138          Medications:  Scheduled:   . acyclovir  10 mg/kg Intravenous Q8H  . cefTRIAXone (ROCEPHIN)  IV  1 g Intravenous Q24H  . heparin  5,000 Units Subcutaneous Q8H  . sodium chloride  3 mL Intravenous Q12H    Objective: Vital signs in last 24 hours: Temp:  [97.3 F (36.3 C)-98.7 F (37.1 C)] 98.7 F (37.1 C) (12/27 1610) Pulse Rate:  [67-95] 77  (12/27 0614) Resp:  [16-18] 16  (12/27 0614) BP: (106-121)/(59-74) 106/59 mmHg (12/27 0614) SpO2:  [97 %-100  %] 100 % (12/27 0614) Weight:  [65.726 kg (144 lb 14.4 oz)] 65.726 kg (144 lb 14.4 oz) (12/27 9604)   General appearance: alert, cooperative and no distress Back: mild tenderness over upper sacral area Resp: clear to auscultation bilaterally Cardio: regular rate and rhythm GI: normal findings: bowel sounds normal and soft, non-tender  Lab Results  [facility] 11/02/12 0520 11/01/12 0545  WBC 18.1* 20.9*  HGB 9.9* 12.2  HCT 29.2* 35.9*  NA 140 141  K 3.5 3.6  CL 106 107  CO2 24 23  BUN 9 8  CREATININE 0.70 0.62  GLU -- --   Liver Panel  Basename 10/30/12 1805  PROT 8.0  ALBUMIN 4.0  AST 19  ALT 12  ALKPHOS 70  BILITOT 0.1*  BILIDIR --  IBILI --   Sedimentation Rate No results found for this basename: ESRSEDRATE in the last 72 hours C-Reactive Protein No results found for this basename: CRP:2 in the last 72 hours  Microbiology: Recent Results (from the past 240 hour(s))  URINE CULTURE  Status: Normal   Collection Time   10/30/12  7:02 PM      Component Value Range Status Comment   Specimen Description URINE, CLEAN CATCH   Final    Special Requests NONE   Final    Culture  Setup Time 10/31/2012 02:29   Final    Colony Count NO GROWTH   Final    Culture NO GROWTH   Final    Report Status 11/01/2012 FINAL   Final   CSF CULTURE     Status: Normal (Preliminary result)   Collection Time   10/30/12 10:00 PM      Component Value Range Status Comment   Specimen Description CSF   Final    Special Requests Normal   Final    Gram Stain     Final    Value: WBC PRESENT,BOTH PMN AND MONONUCLEAR     NO ORGANISMS SEEN   Culture NO GROWTH 1 DAY   Final    Report Status PENDING   Incomplete     Studies/Results: No results found.   Assessment/Plan: UTI HSV2 Meningitis  Total days of antibiotics 3 (acyclovir, ceftriaxonne) consider changing her to oral valtrex for 14 days when ready for d/c Change her to oral anbx for suspected UTI to complete 7 days. Could  consider MRI of her lower spine (although symptomatic treatment may be better first course) Available if questions          Johny Sax Infectious Diseases [phone] 11/02/2012, 11:35 AM   LOS: 3 days

## 2012-11-02 NOTE — Progress Notes (Signed)
[facility] PROGRESS NOTE  Memorie Yokoyama ZOX:[id number] DOB: 02-Jan-1986 DOA: 10/30/2012 PCP: No primary provider on file.  Assessment/Plan:  #1 HSV 2 meningitis.  Patient presented with headache nuchal rigidity or visual disturbance worrisome for meningitis. Patient is status post lumbar puncture in the ED. LP results with 554 WBCs with one segmented neutrophil and 97 lymphocytes. Cultures are pending. Patient with clinical improvement. HIV negative. Influenza panel negative. CSF cultures positive for HSV2. Vancomycin has been discontinued. Decadron has been discontinued per ID. Continue empiric IV  acyclovir. On D/C may change acyclovir to oral valtrex 1000mg  TID x 14 days. Continue Supportive care.   #2 probable UTI Urinalysis worrisome for UTI. Patient with left CVA tenderness on admission. Urine cultures were no growth. Clinical improvement. Change IV Rocephin to oral cipro and treat for total of 7 days. Follow.   #3 Low back pain MRI L spine. Continue current antibiotics and acyclovir.  #4 prophylaxis Heparin for DVT prophylaxis.   Code Status: Full Family Communication: Updated patient and mother at bedside Disposition Plan: Home when medically stable.   Consultants:  Infectious diseases: Dr. Ninetta Lights 10/31/2012  Procedures:  Lumbar puncture 10/30/2012 per ED physician  MRI L spine pending  Antibiotics:  IV vancomycin 10/31/2012 >>> 11/01/2012  IV Rocephin 10/31/2012 >>>> 11/02/2012  IV acyclovir 10/31/2012  cipro 11/02/12  HPI/Subjective: Patient states she feels much better today. Neck pain has improved. Patient denies any headaches. Patient states back pain is moving down but still hurts.  Objective: Filed Vitals:   11/01/12 1247 11/01/12 2152 11/02/12 0614 11/02/12 1346  BP: 121/71 110/74 106/59 112/73  Pulse: 95 67 77 72  Temp: 97.3 F (36.3 C) 98.4 F (36.9 C) 98.7 F (37.1 C) 98.8 F (37.1 C)  TempSrc: Oral Oral Oral Oral  Resp: 18 18 16  17   Height:      Weight:   65.726 kg (144 lb 14.4 oz)   SpO2: 97% 100% 100% 100%    Intake/Output Summary (Last 24 hours) at 11/02/12 1622 Last data filed at 11/02/12 1358  Gross per 24 hour  Intake 6307.67 ml  Output   1150 ml  Net 5157.67 ml   Filed Weights   10/31/12 0434 11/01/12 0536 11/02/12 0614  Weight: 65.817 kg (145 lb 1.6 oz) 64.819 kg (142 lb 14.4 oz) 65.726 kg (144 lb 14.4 oz)    Exam:   General:  NAD  Cardiovascular: RRR no murmurs rubs or gallops, no JVD, no lower extremity edema,  Respiratory: Clear to auscultation bilaterally. No wheezes no crackles no rhonchi  Abdomen: Soft, nontender, nondistended, positive bowel sounds. Left CVA tenderness decreased.  Data Reviewed: Basic Metabolic Panel:  Lab 11/02/12 8119 11/01/12 0545 10/31/12 0843 10/30/12 1805  NA 140 141 -- 137  K 3.5 3.6 -- 4.2  CL 106 107 -- 99  CO2 24 23 -- 27  GLUCOSE 90 137* -- 103*  BUN 9 8 -- 8  CREATININE 0.70 0.62 0.57 0.80  CALCIUM 8.6 9.2 -- 9.3  MG -- -- -- --  PHOS -- -- -- --   Liver Function Tests:  Lab 10/30/12 1805  AST 19  ALT 12  ALKPHOS 70  BILITOT 0.1*  PROT 8.0  ALBUMIN 4.0   No results found for this basename: LIPASE:5,AMYLASE:5 in the last 168 hours No results found for this basename: AMMONIA:5 in the last 168 hours CBC:  Lab 11/02/12 0520 11/01/12 0545 10/31/12 0843 10/30/12 1805  WBC 18.1* 20.9* 9.9 11.7*  NEUTROABS -- 18.4* --  8.8*  HGB 9.9* 12.2 12.7 13.5  HCT 29.2* 35.9* 37.4 39.3  MCV 83.9 83.5 83.3 85.1  PLT 302 351 329 362   Cardiac Enzymes: No results found for this basename: CKTOTAL:5,CKMB:5,CKMBINDEX:5,TROPONINI:5 in the last 168 hours BNP (last 3 results) No results found for this basename: PROBNP:3 in the last 8760 hours CBG: No results found for this basename: GLUCAP:5 in the last 168 hours  Recent Results (from the past 240 hour(s))  URINE CULTURE     Status: Normal   Collection Time   10/30/12  7:02 PM      Component Value  Range Status Comment   Specimen Description URINE, CLEAN CATCH   Final    Special Requests NONE   Final    Culture  Setup Time 10/31/2012 02:29   Final    Colony Count NO GROWTH   Final    Culture NO GROWTH   Final    Report Status 11/01/2012 FINAL   Final   CSF CULTURE     Status: Normal (Preliminary result)   Collection Time   10/30/12 10:00 PM      Component Value Range Status Comment   Specimen Description CSF   Final    Special Requests Normal   Final    Gram Stain     Final    Value: WBC PRESENT,BOTH PMN AND MONONUCLEAR     NO ORGANISMS SEEN   Culture NO GROWTH 2 DAYS   Final    Report Status PENDING   Incomplete      Studies: No results found.  Scheduled Meds:    . acyclovir  10 mg/kg Intravenous Q8H  . ciprofloxacin  500 mg Oral BID  . heparin  5,000 Units Subcutaneous Q8H  . sodium chloride  3 mL Intravenous Q12H   Continuous Infusions:    Principal Problem:  *Viral meningitis Active Problems:  UTI (lower urinary tract infection)  Pyelonephritis    Time spent: > 35 mins    [facility]  [facility] Pager [phone]. If 8PM-8AM, please contact night-coverage at [email or web address], password [facility] 11/02/2012, 4:22 PM  LOS: 3 days

## 2012-11-03 ENCOUNTER — Inpatient Hospital Stay (HOSPITAL_COMMUNITY): Payer: MEDICAID

## 2012-11-03 LAB — BASIC METABOLIC PANEL
BUN: 12 mg/dL (ref 6–23)
Calcium: 8.7 mg/dL (ref 8.4–10.5)
Chloride: 102 mEq/L (ref 96–112)
Creatinine, Ser: 0.75 mg/dL (ref 0.50–1.10)
GFR calc Af Amer: >90 mL/min (ref >90)
GFR calc non Af Amer: >90 mL/min (ref >90)

## 2012-11-03 LAB — CSF CULTURE W GRAM STAIN: Special Requests: NORMAL

## 2012-11-03 LAB — CBC
HCT: 33.1 % — ABNORMAL LOW (ref 36.0–46.0)
MCHC: 34.7 g/dL (ref 30.0–36.0)
Platelets: 339 10*3/uL (ref 150–400)
RDW: 13.3 % (ref 11.5–15.5)
WBC: 11.9 10*3/uL — ABNORMAL HIGH (ref 4.0–10.5)

## 2012-11-03 MED ORDER — IOHEXOL 300 MG/ML  SOLN
20.0000 mL | INTRAMUSCULAR | Status: AC
  Administered 2012-11-03 (×2): 20 mL via ORAL

## 2012-11-03 MED ORDER — ACYCLOVIR 800 MG PO TABS
1000.0000 mg | ORAL_TABLET | Freq: Three times a day (TID) | ORAL | Status: DC
  Filled 2012-11-03 (×2): qty 0.5

## 2012-11-03 MED ORDER — IOHEXOL 300 MG/ML  SOLN
100.0000 mL | Freq: Once | INTRAMUSCULAR | Status: AC | PRN
  Administered 2012-11-03: 100 mL via INTRAVENOUS

## 2012-11-03 MED ORDER — DEXTROSE 5 % IV SOLN
500.0000 mg | Freq: Four times a day (QID) | INTRAVENOUS | Status: DC | PRN
  Filled 2012-11-03 (×2): qty 5

## 2012-11-03 MED ORDER — METHOCARBAMOL 500 MG PO TABS
500.0000 mg | ORAL_TABLET | Freq: Four times a day (QID) | ORAL | Status: DC | PRN
  Administered 2012-11-03: 500 mg via ORAL
  Filled 2012-11-03: qty 1

## 2012-11-03 MED ORDER — VALACYCLOVIR HCL 500 MG PO TABS
1000.0000 mg | ORAL_TABLET | Freq: Three times a day (TID) | ORAL | Status: DC
  Administered 2012-11-03 – 2012-11-04 (×3): 1000 mg via ORAL
  Filled 2012-11-03 (×5): qty 2

## 2012-11-03 NOTE — Progress Notes (Signed)
Both [facility] and [facility] Disease were notified concerning an abnormal MRI result.  No new orders were given.  RN will continue to monitor the patient.

## 2012-11-03 NOTE — Progress Notes (Signed)
[facility] PROGRESS NOTE  Envi Eagleson ONG:[id number] DOB: 10-16-1986 DOA: 10/30/2012 PCP: No primary provider on file.  Assessment/Plan:  #1 HSV 2 meningitis.  Patient presented with symptoms worrisome for meningitis.  status post lumbar puncture in the ED. LP results with 554 WBCs with one segmented neutrophil and 97 lymphocytes. Patient with clinical improvement. HIV negative. Influenza panel negative. CSFserology  positive for HSV2--awaiting final CSF cultures. Vancomycin has been discontinued. Decadron has been discontinued per ID.  Received IV Acyclocir from 12/24 until 12/27-changed to oral valtrex 1000mg  TID x 14 days on 12/28.  #2 probable UTI Urinalysis worrisome for UTI. Patient with left CVA tenderness on admission. Urine cultures were no growth. Clinical improvement. Change IV Rocephin 12/26 to oral cipro and treat for total of 7 days-Stop date=12/31   #3 Low back pain MRI L spine. Continue current antibiotics and acyclovir.unclear etiology. This seems to be getting somewhat better and may be related to her urinary tract infection. Currently in the process of getting CT abdomen to rule out intra-abdominal source.  #4 prophylaxis Heparin for DVT prophylaxis.   Code Status: Full Family Communication: Updated patient and mother at bedside Disposition Plan: Home when medically stable.   Consultants:  Infectious diseases: Dr. Ninetta Lights 10/31/2012  Procedures:  Lumbar puncture 10/30/2012 per ED physician  MRI L spine showed fluid collection between psoas and iliacus-indeterminate.  Antibiotics:  IV vancomycin 10/31/2012 >>> 11/01/2012  IV Rocephin 10/31/2012 >>>> 11/02/2012  IV acyclovir 10/31/2012  cipro 11/02/12  HPI/Subjective: Patient states she feels somewhat better. CP and still has lower back pain. When sitting up she has increasing pain. No nausea or vomiting and tolerating by mouth diet. Denies any fever or chills. Still has upper back pain as  well.  Objective: Filed Vitals:   11/02/12 0614 11/02/12 1346 11/02/12 2141 11/03/12 0447  BP: 106/59 112/73 107/73 103/68  Pulse: 77 72 67 68  Temp: 98.7 F (37.1 C) 98.8 F (37.1 C) 98.7 F (37.1 C) 98.3 F (36.8 C)  TempSrc: Oral Oral Oral Oral  Resp: 16 17 18 16   Height:      Weight: 65.726 kg (144 lb 14.4 oz)   65.5 kg (144 lb 6.4 oz)  SpO2: 100% 100% 100% 100%    Intake/Output Summary (Last 24 hours) at 11/03/12 1042 Last data filed at 11/03/12 0300  Gross per 24 hour  Intake  802.6 ml  Output   2100 ml  Net -1297.4 ml   Filed Weights   11/01/12 0536 11/02/12 0614 11/03/12 0447  Weight: 64.819 kg (142 lb 14.4 oz) 65.726 kg (144 lb 14.4 oz) 65.5 kg (144 lb 6.4 oz)    Exam:   General:  NAD  Cardiovascular: RRR no murmurs rubs or gallops, no JVD, no lower extremity edema,  Respiratory: Clear to auscultation bilaterally. No wheezes no crackles no rhonchi  Abdomen: Soft, nontender, nondistended, positive bowel sounds. Left CVA tenderness decreased.  Data Reviewed: Basic Metabolic Panel:  Lab 11/03/12 4401 11/02/12 0520 11/01/12 0545 10/31/12 0843 10/30/12 1805  NA 136 140 141 -- 137  K 3.7 3.5 3.6 -- 4.2  CL 102 106 107 -- 99  CO2 25 24 23  -- 27  GLUCOSE 88 90 137* -- 103*  BUN 12 9 8  -- 8  CREATININE 0.75 0.70 0.62 0.57 0.80  CALCIUM 8.7 8.6 9.2 -- 9.3  MG -- -- -- -- --  PHOS -- -- -- -- --   Liver Function Tests:  Lab 10/30/12 1805  AST 19  ALT 12  ALKPHOS 70  BILITOT 0.1*  PROT 8.0  ALBUMIN 4.0   No results found for this basename: LIPASE:5,AMYLASE:5 in the last 168 hours No results found for this basename: AMMONIA:5 in the last 168 hours CBC:  Lab 11/03/12 0539 11/02/12 0520 11/01/12 0545 10/31/12 0843 10/30/12 1805  WBC 11.9* 18.1* 20.9* 9.9 11.7*  NEUTROABS -- -- 18.4* -- 8.8*  HGB 11.5* 9.9* 12.2 12.7 13.5  HCT 33.1* 29.2* 35.9* 37.4 39.3  MCV 83.6 83.9 83.5 83.3 85.1  PLT 339 302 351 329 362   Cardiac Enzymes: No results found  for this basename: CKTOTAL:5,CKMB:5,CKMBINDEX:5,TROPONINI:5 in the last 168 hours BNP (last 3 results) No results found for this basename: PROBNP:3 in the last 8760 hours CBG: No results found for this basename: GLUCAP:5 in the last 168 hours  Recent Results (from the past 240 hour(s))  URINE CULTURE     Status: Normal   Collection Time   10/30/12  7:02 PM      Component Value Range Status Comment   Specimen Description URINE, CLEAN CATCH   Final    Special Requests NONE   Final    Culture  Setup Time 10/31/2012 02:29   Final    Colony Count NO GROWTH   Final    Culture NO GROWTH   Final    Report Status 11/01/2012 FINAL   Final   CSF CULTURE     Status: Normal (Preliminary result)   Collection Time   10/30/12 10:00 PM      Component Value Range Status Comment   Specimen Description CSF   Final    Special Requests Normal   Final    Gram Stain     Final    Value: WBC PRESENT,BOTH PMN AND MONONUCLEAR     NO ORGANISMS SEEN   Culture NO GROWTH 2 DAYS   Final    Report Status PENDING   Incomplete      Studies: Mr Lumbar Spine Wo Contrast  11/02/2012  *RADIOLOGY REPORT*  Clinical Data: Back pain extending into legs.  MRI LUMBAR SPINE WITHOUT CONTRAST  Technique:  Multiplanar and multiecho pulse sequences of the lumbar spine were obtained without intravenous contrast.  Comparison: None.  Findings: Last fully open disc space is labeled L5-S1.  Present examination incorporates from T10-11 disc space through lower sacrum.  Conus L1-2 disc space level.  There is fluid within portions of the psoas muscle and between the psoas and iliacus muscle bilaterally.  Etiology of this is indeterminate.  This raises possibility of infection.  On the present noncontrast examination, no definitive findings of diskitis or septic arthritis.  No obvious epidural abscess.  The patient may benefit from CT of the abdomen and pelvis to determine if there is an abdominal/pelvic source for the fluid collection (such  as infection).  If this does not prove helpful with regard to detecting the source of fluid collection then contrast enhanced MR of the lumbar spine including direct coronal imaging may be considered for further delineation.  No evidence of disc herniation, significant spinal stenosis, foraminal narrowing or nerve root compression.  IMPRESSION: There is fluid within portions of the psoas muscle and between the psoas and iliacus muscle bilaterally.  Etiology of this is indeterminate.  This raises possibility of infection.  Please see above discussion with regard to follow-up.  Critical Value/emergent results were called by telephone at the time of interpretation on 11/02/2012 at 10:30 p.m. to Shanda Bumps the patient's nurse, who verbally acknowledged  these results.   Original Report Authenticated By: Lacy Duverney, M.D.     Scheduled Meds:    . acyclovir  10 mg/kg Intravenous Q8H  . ciprofloxacin  500 mg Oral BID  . heparin  5,000 Units Subcutaneous Q8H  . 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 If 8PM-8AM, please contact night-coverage at [email or web address], password [email or web address] 11/03/2012, 10:42 AM  LOS: 4 days

## 2012-11-04 LAB — CBC WITH DIFFERENTIAL/PLATELET
Eosinophils Absolute: 0.2 10*3/uL (ref 0.0–0.7)
Hemoglobin: 11.5 g/dL — ABNORMAL LOW (ref 12.0–15.0)
Lymphs Abs: 5.3 10*3/uL — ABNORMAL HIGH (ref 0.7–4.0)
MCH: 27.7 pg (ref 26.0–34.0)
Monocytes Relative: 6 % (ref 3–12)
Neutro Abs: 3.8 10*3/uL (ref 1.7–7.7)
Neutrophils Relative %: 39 % — ABNORMAL LOW (ref 43–77)
Platelets: 366 10*3/uL (ref 150–400)
RBC: 4.15 MIL/uL (ref 3.87–5.11)
WBC: 9.9 10*3/uL (ref 4.0–10.5)

## 2012-11-04 MED ORDER — OXYCODONE HCL 5 MG PO TABS: 5.0000 mg | ORAL_TABLET | Freq: Four times a day (QID) | ORAL | Status: DC | PRN

## 2012-11-04 MED ORDER — ACYCLOVIR 800 MG PO TABS: 800.0000 mg | ORAL_TABLET | Freq: Three times a day (TID) | ORAL | Status: DC

## 2012-11-04 MED ORDER — METHOCARBAMOL 500 MG PO TABS: 500.0000 mg | ORAL_TABLET | Freq: Four times a day (QID) | ORAL | Status: DC | PRN

## 2012-11-04 NOTE — Progress Notes (Signed)
Pt given DC instructions and verbalized understanding.  Pt DC home via wc.  

## 2012-11-04 NOTE — Discharge Summary (Signed)
Physician Discharge Summary  Sylvia Garcia GMW:[id number] DOB: 1986/09/01 DOA: 10/30/2012  PCP: No primary provider on file.  Admit date: 10/30/2012 Discharge date: 11/04/2012  Time spent: 35 minutes  Recommendations for Outpatient Follow-up:  1. Patient was encouraged to find a primary care physician 2. 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 She subsequently had an extensive workup and was started on broad-spectrum antibiotics and antivirals including Decadron. Please see below for further details  Hospital Course:   #1 HSV 2 meningitis.  Patient presented with symptoms worrisome for meningitis. status post lumbar puncture in the ED. LP results with 554 WBCs with one segmented neutrophil and 97 lymphocytes. Patient with clinical improvement. HIV negative. Influenza panel negative. CSFserology positive for HSV2-- final CSF cultures after 3 days showed no growth. Vancomycin has been discontinued. Decadron has been discontinued per ID. Received IV Acyclocir from 12/24 until 12/27-changed to oral acyclovir 800 mg 3 times a day stop date the 12 31 2013-her white count dropped from 11.9-9.9 but she was on steroids. #2 probable UTI  Urinalysis worrisome for UTI-urine culture was  negative Patient with left CVA tenderness on admission.  Clinical improvement. Change IV Rocephin 12/26 to oral cipro and treated for a total of 6 days. #3 Low back pain  MRI L spine. Continue current antibiotics and acyclovir.unclear etiology. This seems to be getting somewhat better and may be related to her urinary tract infection-differential diagnosis considered was palpable muscle spasm secondary to lumbar puncture.  CT abdomen to rule out intra-abdominal source was done given MRI lower spine showed some fluid collection which is not confirmed on CT scan-patient had symptomatic relief with Robaxin and various pain medications such as Percocet. She'll be given limited prescriptions of both of these and if she is able to tolerate regular activity, will be discharged home.   Consultants:  Infectious diseases: Dr. Ninetta Lights 10/31/2012  Procedures:  Lumbar puncture 10/30/2012 per ED physician  MRI L spine showed fluid collection between psoas and iliacus-indeterminate.  Antibiotics:  IV vancomycin 10/31/2012 >>> 11/01/2012  IV Rocephin 10/31/2012 >>>> 11/02/2012  IV acyclovir 10/31/2012  Patient was on valacyclovir for 2 days Transitioned from valacyclovir to acyclovir 12/29 for 3 more days stop date 11/06/12 cipro 11/02/12-12/29  Discharge Exam: Filed Vitals:   11/03/12 0447 11/03/12 1414 11/03/12 2117 11/04/12 0341  BP: 103/68 112/73 93/67 92/54   Pulse: 68 68 70 72  Temp: 98.3 F (36.8 C) 97.8 F (36.6 C) 97.8 F (36.6 C) 98.8 F (37.1 C)  TempSrc: Oral Oral Oral Oral  Resp: 16 18 18 18   Height:      Weight: 65.5 kg (144 lb 6.4 oz)   64.139 kg (141 lb 6.4 oz)  SpO2: 100% 100% 100% 100%   Alert doing well looks more relaxed. In no pain whatsoever now. Wishes to go home.  Denies fever chills nausea vomiting  General: Alert pleasant oriented African American female no apparent distress Cardiovascular: S1-S2 no murmur rub or gallop Respiratory: Clinically clear  Discharge  Instructions     Medication List     As of 11/04/2012 10:23 AM    TAKE these medications         acyclovir 800 MG tablet   Commonly known as: ZOVIRAX   Take 1 tablet (800 mg total) by mouth 3 (three) times daily.      BC HEADACHE POWDER PO   Take 1 packet by mouth daily as needed. Patient used this medication for her headache.      methocarbamol 500 MG tablet   Commonly known as: ROBAXIN   Take 1 tablet (500 mg total) by mouth every 6 (six) hours as needed.      oxyCODONE 5 MG immediate release tablet   Commonly known as: Oxy IR/ROXICODONE   Take 1 tablet (5 mg total) by mouth every 6 (six) hours as needed.          The results of significant diagnostics from this hospitalization (including imaging, microbiology, ancillary and laboratory) are listed below for reference.    Significant Diagnostic Studies: Dg Chest 2 View  10/30/2012  *RADIOLOGY REPORT*  Clinical Data: 26 year old female headache, neck and back pain. Fever, runny nose.  CHEST - 2 VIEW  Comparison: 08/07/2012.  Findings: Normal lung volumes. Normal cardiac size and mediastinal contours.  Visualized tracheal air column is within normal limits. Lung parenchyma is stable and clear.  No pneumothorax or effusion. No osseous abnormality identified.  IMPRESSION: Stable and negative, no acute cardiopulmonary abnormality.   Original Report Authenticated By: Erskine Speed, M.D.    Ct Head Wo Contrast  10/30/2012  *RADIOLOGY REPORT*  Clinical Data: Headache, neck and back pain, cough and fever  CT HEAD WITHOUT CONTRAST  Technique:  Contiguous axial images were obtained from the base of the skull through the vertex without contrast.  Comparison: None.  Findings: The ventricular system is normal in size and configuration, and the septum is in a normal midline position.  The fourth ventricle and basilar cisterns appear normal.  No hemorrhage, mass lesion, or acute infarction is seen.  On bone window images, no calvarial  abnormality is noted. There is mild mucosal thickening within the sphenoid sinus but no air-fluid level is seen.  IMPRESSION:  1.  No acute intracranial abnormality. 2.  Some mucosal thickening in the sphenoid sinus.  However, no air- fluid level is seen.   Original Report Authenticated By: Dwyane Dee, M.D.    Mr Lumbar Spine Wo Contrast  11/02/2012  *RADIOLOGY REPORT*  Clinical Data: Back pain extending into legs.  MRI LUMBAR SPINE WITHOUT CONTRAST  Technique:  Multiplanar and multiecho pulse sequences of the lumbar spine were obtained without intravenous contrast.  Comparison: None.  Findings: Last fully open disc space is labeled L5-S1.  Present examination incorporates from T10-11 disc space through lower sacrum.  Conus L1-2 disc space level.  There is fluid within portions of the psoas muscle and between the psoas and iliacus muscle bilaterally.  Etiology of this is indeterminate.  This raises possibility of infection.  On the present noncontrast examination, no definitive findings of diskitis or septic arthritis.  No obvious epidural abscess.  The patient may benefit from CT of the abdomen and pelvis to determine if there is an abdominal/pelvic source for the fluid collection (such as infection).  If this does not prove helpful with  regard to detecting the source of fluid collection then contrast enhanced MR of the lumbar spine including direct coronal imaging may be considered for further delineation.  No evidence of disc herniation, significant spinal stenosis, foraminal narrowing or nerve root compression.  IMPRESSION: There is fluid within portions of the psoas muscle and between the psoas and iliacus muscle bilaterally.  Etiology of this is indeterminate.  This raises possibility of infection.  Please see above discussion with regard to follow-up.  Critical Value/emergent results were called by telephone at the time of interpretation on 11/02/2012 at 10:30 p.m. to [facility] the patient's nurse, who  verbally acknowledged these results.   Original Report Authenticated By: Lacy Duverney, M.D.    Ct Abdomen Pelvis W Contrast  11/03/2012  *RADIOLOGY REPORT*  Clinical Data: Intramuscular fluid in the iliopsoas complex bilaterally on recent MRI.  CT ABDOMEN AND PELVIS WITH CONTRAST  Technique:  Multidetector CT imaging of the abdomen and pelvis was performed following the standard protocol during bolus administration of intravenous contrast.  Contrast: OMNIPAQUE IOHEXOL 300 MG/ML  SOLN  Comparison: Lumbar spine MRI from 11/02/2012.  Findings: 6 mm low density lesion in the right liver is too small to characterize but probably represents a tiny cyst.  The spleen, stomach, duodenum, and adrenal glands are normal.  The kidneys have normal CT imaging features bilaterally.  Layering sludge or stones noted in the lumen of the gallbladder. No definite gallbladder wall thickening or pericholecystic fluid.  There is mild dilatation of the main pancreatic duct measuring up to 5 mm in diameter.  No pancreatic head mass or etiology for the pancreatic ductal dilatation is evident. No associated biliary duct dilatation.  No abdominal aortic aneurysm.  There is no free fluid or lymphadenopathy in the abdomen.  Imaging through the pelvis shows no free intraperitoneal fluid. Uterus is unremarkable.  No adnexal mass.  The terminal ileum is normal.  The appendix is filled with contrast and air without inflammatory change.  There is no evidence for a rim enhancing fluid collection within either iliopsoas complex to suggest a discrete/drainable abscess.  Bone windows reveal no worrisome lytic or sclerotic osseous lesions.  IMPRESSION: No discrete or rim enhancing fluid collection within either iliopsoas complex.  On the recent MRI, the finding represented fluid tracking between the muscles and was not a focal fluid collection on either side.  This degree of fluid intercalating between the muscle fibers would be difficult to  discern on CT.  Mild diffuse dilatation of the main pancreatic duct of indeterminate etiology.  MRCP or ERCP may prove helpful to further evaluate.   Original Report Authenticated By: Kennith Center, M.D.     Microbiology: Recent Results (from the past 240 hour(s))  URINE CULTURE     Status: Normal   Collection Time   10/30/12  7:02 PM      Component Value Range Status Comment   Specimen Description URINE, CLEAN CATCH   Final    Special Requests NONE   Final    Culture  Setup Time 10/31/2012 02:29   Final    Colony Count NO GROWTH   Final    Culture NO GROWTH   Final    Report Status 11/01/2012 FINAL   Final   CSF CULTURE     Status: Normal   Collection Time   10/30/12 10:00 PM      Component Value Range Status Comment   Specimen Description CSF   Final    Special Requests Normal  Final    Gram Stain     Final    Value: WBC PRESENT,BOTH PMN AND MONONUCLEAR     NO ORGANISMS SEEN   Culture NO GROWTH 3 DAYS   Final    Report Status 11/03/2012 FINAL   Final      Labs: Basic Metabolic Panel:  Lab 11/03/12 9811 11/02/12 0520 11/01/12 0545 10/31/12 0843 10/30/12 1805  NA 136 140 141 -- 137  K 3.7 3.5 3.6 -- 4.2  CL 102 106 107 -- 99  CO2 25 24 23  -- 27  GLUCOSE 88 90 137* -- 103*  BUN 12 9 8  -- 8  CREATININE 0.75 0.70 0.62 0.57 0.80  CALCIUM 8.7 8.6 9.2 -- 9.3  MG -- -- -- -- --  PHOS -- -- -- -- --   Liver Function Tests:  Lab 10/30/12 1805  AST 19  ALT 12  ALKPHOS 70  BILITOT 0.1*  PROT 8.0  ALBUMIN 4.0   No results found for this basename: LIPASE:5,AMYLASE:5 in the last 168 hours No results found for this basename: AMMONIA:5 in the last 168 hours CBC:  Lab 11/04/12 0515 11/03/12 0539 11/02/12 0520 11/01/12 0545 10/31/12 0843 10/30/12 1805  WBC 9.9 11.9* 18.1* 20.9* 9.9 --  NEUTROABS 3.8 -- -- 18.4* -- 8.8*  HGB 11.5* 11.5* 9.9* 12.2 12.7 --  HCT 34.4* 33.1* 29.2* 35.9* 37.4 --  MCV 82.9 83.6 83.9 83.5 83.3 --  PLT 366 339 302 351 329 --   Cardiac  Enzymes: No results found for this basename: CKTOTAL:5,CKMB:5,CKMBINDEX:5,TROPONINI:5 in the last 168 hours BNP: BNP (last 3 results) No results found for this basename: PROBNP:3 in the last 8760 hours CBG: No results found for this basename: GLUCAP:5 in the last 168 hours     Signed:  Rhetta Mura  [facility] 11/04/2012, 10:23 AM

## 2012-11-05 LAB — ENTEROVIRUS PCR

## 2013-11-18 ENCOUNTER — Encounter (HOSPITAL_BASED_OUTPATIENT_CLINIC_OR_DEPARTMENT_OTHER): Payer: Self-pay | Admitting: Emergency Medicine

## 2013-11-18 ENCOUNTER — Emergency Department (HOSPITAL_BASED_OUTPATIENT_CLINIC_OR_DEPARTMENT_OTHER)
Admission: EM | Admit: 2013-11-18 | Discharge: 2013-11-18 | Disposition: A | Discharge status: Home / Self Care | Payer: Self-pay | Attending: Emergency Medicine | Admitting: Emergency Medicine

## 2013-11-18 DIAGNOSIS — R05 Cough: Secondary | ICD-10-CM | POA: Insufficient documentation

## 2013-11-18 DIAGNOSIS — F17200 Nicotine dependence, unspecified, uncomplicated: Secondary | ICD-10-CM | POA: Insufficient documentation

## 2013-11-18 DIAGNOSIS — Z79899 Other long term (current) drug therapy: Secondary | ICD-10-CM | POA: Insufficient documentation

## 2013-11-18 DIAGNOSIS — H169 Unspecified keratitis: Secondary | ICD-10-CM | POA: Insufficient documentation

## 2013-11-18 DIAGNOSIS — J3489 Other specified disorders of nose and nasal sinuses: Secondary | ICD-10-CM | POA: Insufficient documentation

## 2013-11-18 DIAGNOSIS — R059 Cough, unspecified: Secondary | ICD-10-CM | POA: Insufficient documentation

## 2013-11-18 MED ORDER — FLUORESCEIN SODIUM 1 MG OP STRP
1.0000 | ORAL_STRIP | Freq: Once | OPHTHALMIC | Status: AC
  Administered 2013-11-18: 1 via OPHTHALMIC
  Filled 2013-11-18: qty 1

## 2013-11-18 MED ORDER — IBUPROFEN 400 MG PO TABS
600.0000 mg | ORAL_TABLET | Freq: Once | ORAL | Status: AC
  Administered 2013-11-18: 600 mg via ORAL
  Filled 2013-11-18 (×2): qty 1

## 2013-11-18 MED ORDER — MOXIFLOXACIN HCL 0.5 % OP SOLN: 1.0000 [drp] | Freq: Four times a day (QID) | OPHTHALMIC | Status: DC

## 2013-11-18 MED ORDER — TETRACAINE HCL 0.5 % OP SOLN
2.0000 [drp] | Freq: Once | OPHTHALMIC | Status: AC
  Administered 2013-11-18: 2 [drp] via OPHTHALMIC
  Filled 2013-11-18: qty 2

## 2013-11-18 NOTE — ED Notes (Signed)
Pt c/o right eye redness and drainage.

## 2013-11-18 NOTE — ED Provider Notes (Signed)
CSN: [id number]     Arrival date & time 11/18/13  1352 History   First MD Initiated Contact with Patient 11/18/13 1502     Chief Complaint  Patient presents with  . Eye Problem   (Consider location/radiation/quality/duration/timing/severity/associated sxs/prior Treatment) HPI Comments: 28 year old female presents with 6 hours of right eye pain, redness, and watery discharge. She states the pain started acutely while she was at school. She's a Runner, broadcasting/film/video. There is no trauma or foreign body. States the pain is a severe pain feels like it's behind her eye. She states she also is having a headache from this. Immediately when this started she also had rhinorrhea she has been having a dry cough recently as well. No fevers. She's not taken anything for the pain. She wears contacts but took them out. She does occur patient is blurrier. She is having photophobia as well. She also feels like her right eyelid is swollen.   History reviewed. No pertinent past medical history. History reviewed. No pertinent past surgical history. History reviewed. No pertinent family history. History  Substance Use Topics  . Smoking status: Current Every Day Smoker -- 0.50 packs/day    Types: Cigarettes  . Smokeless tobacco: Not on file  . Alcohol Use: Yes     Comment: occasional   OB History   Grav Para Term Preterm Abortions TAB SAB Ect Mult Living                 Review of Systems  Constitutional: Negative for fever and chills.  HENT: Positive for rhinorrhea. Negative for congestion, ear pain, facial swelling and sore throat.   Eyes: Positive for photophobia, pain, discharge, redness and visual disturbance.  Respiratory: Positive for cough. Negative for shortness of breath.   Gastrointestinal: Negative for vomiting.  All other systems reviewed and are negative.    Allergies  Review of patient's allergies indicates no known allergies.  Home Medications   Current Outpatient Rx  Name  Route  Sig  Dispense   Refill  . acyclovir (ZOVIRAX) 800 MG tablet   Oral   Take 1 tablet (800 mg total) by mouth 3 (three) times daily.   9 tablet   0   . Aspirin-Salicylamide-Caffeine (BC HEADACHE POWDER PO)   Oral   Take 1 packet by mouth daily as needed. Patient used this medication for her headache.         . methocarbamol (ROBAXIN) 500 MG tablet   Oral   Take 1 tablet (500 mg total) by mouth every 6 (six) hours as needed.   30 tablet   0   . oxyCODONE (OXY IR/ROXICODONE) 5 MG immediate release tablet   Oral   Take 1 tablet (5 mg total) by mouth every 6 (six) hours as needed.   30 tablet   0    BP 129/84  Pulse 82  Temp(Src) 98.1 F (36.7 C) (Oral)  Resp 18  Ht 5\' 5"  (1.651 m)  Wt 148 lb (67.132 kg)  BMI 24.63 kg/m2  SpO2 100%  LMP 11/11/2013 Physical Exam  Nursing note and vitals reviewed. Constitutional: She is oriented to person, place, and time. She appears well-developed and well-nourished. No distress.  HENT:  Head: Normocephalic and atraumatic.  Right Ear: External ear normal.  Left Ear: External ear normal.  Nose: Nose normal.  Right IOP 13  Eyes: EOM are normal. Pupils are equal, round, and reactive to light. Right eye exhibits discharge (Clear watery discharge). Right eye exhibits no exudate. Left eye exhibits  no discharge. Right conjunctiva is injected. No scleral icterus.  Mild right lid swelling/periorbital swelling with faint erythema. No abscess  Cardiovascular: Normal rate, regular rhythm and normal heart sounds.   Pulmonary/Chest: Effort normal and breath sounds normal. She has no wheezes. She has no rales.  Abdominal: Soft. There is no tenderness.  Lymphadenopathy:    She has no cervical adenopathy.  Neurological: She is alert and oriented to person, place, and time.  Skin: Skin is warm and dry.    ED Course  Procedures (including critical care time) Labs Review Labs Reviewed - No data to display Imaging Review No results found.  EKG Interpretation    None       MDM   1. Right keratitis    Patient with unilateral inflammed conjunctive. Clear dischage, but given her pain there is concern for possible bacterial source. Vision 20/40 right, 20/30 left. Contacts are out. No uptake seen on fluorescein to suggest HSV. IOP 13. D/w ophtho on call Dr. Burgess Estelleanner, who recommends covering for keratitis given her contacts history with vigamox, and he will see tomorrow. Patient will start on abx and f/u in AM. No signs of foreign body, including under both lids.    Audree CamelScott T Saralynn Langhorst, MD 11/19/13 [phone]

## 2013-11-18 NOTE — Discharge Instructions (Signed)
Do not wear contact lenses until you have been evaluated by the eye doctor. Take the Vigamox 4-5 times per day in the right eye. If your symptoms significantly worsen or other symptoms arise come back to the ER for evaluation

## 2020-04-30 ENCOUNTER — Encounter (HOSPITAL_BASED_OUTPATIENT_CLINIC_OR_DEPARTMENT_OTHER): Payer: Self-pay | Admitting: *Deleted

## 2020-04-30 ENCOUNTER — Other Ambulatory Visit: Payer: Self-pay

## 2020-04-30 DIAGNOSIS — R200 Anesthesia of skin: Secondary | ICD-10-CM | POA: Diagnosis present

## 2020-04-30 DIAGNOSIS — R202 Paresthesia of skin: Secondary | ICD-10-CM | POA: Insufficient documentation

## 2020-04-30 DIAGNOSIS — Z87891 Personal history of nicotine dependence: Secondary | ICD-10-CM | POA: Insufficient documentation

## 2020-04-30 DIAGNOSIS — R208 Other disturbances of skin sensation: Secondary | ICD-10-CM | POA: Diagnosis not present

## 2020-04-30 NOTE — ED Triage Notes (Signed)
Left leg numbness for a week. Sensation is not getting better with time. She feels like the skin on the left side of her leg is hot. Hot feeling has gradually moved up to her abdomen. After flying to [place] a month ago she had swelling in her left foot and ankle.

## 2020-05-01 ENCOUNTER — Ambulatory Visit (HOSPITAL_BASED_OUTPATIENT_CLINIC_OR_DEPARTMENT_OTHER)
Admission: RE | Admit: 2020-05-01 | Discharge: 2020-05-01 | Disposition: A | Discharge status: Home / Self Care | Payer: BC Managed Care – PPO | Source: Ambulatory Visit | Attending: Emergency Medicine | Admitting: Emergency Medicine

## 2020-05-01 ENCOUNTER — Emergency Department (HOSPITAL_BASED_OUTPATIENT_CLINIC_OR_DEPARTMENT_OTHER)
Admission: EM | Admit: 2020-05-01 | Discharge: 2020-05-01 | Disposition: A | Discharge status: Home / Self Care | Payer: BC Managed Care – PPO | Attending: Emergency Medicine | Admitting: Emergency Medicine

## 2020-05-01 DIAGNOSIS — R202 Paresthesia of skin: Secondary | ICD-10-CM

## 2020-05-01 DIAGNOSIS — R208 Other disturbances of skin sensation: Secondary | ICD-10-CM

## 2020-05-01 NOTE — ED Provider Notes (Signed)
[place] [facility] Provider Note: Georgena Spurling, MD, FACEP  CSN: [id number] MRN: [id number] ARRIVAL: 04/30/20 at 2141 ROOM: [place]  Numbness   HISTORY OF PRESENT ILLNESS  05/01/20 2:26 AM Sylvia Garcia is a 34 y.o. female who complains of numbness and an increased sensation of warmth in her left lower extremity that began about a week ago.  It started in her left foot and is progressed up to her left lumbar region.  It is not warm to the touch but the leg and back to themselves since warmth when touched.  She denies any pain in her back or leg.  She denies change in her sensations with movement of her back or hip.  She traveled about 3 weeks ago and had swelling of her left leg that has since resolved.  She had a Covid vaccine but ended the series in March of this year.  The pattern of her numbness and allodynia are not dermatomal but involve the entire leg.   History reviewed. No pertinent past medical history.  History reviewed. No pertinent surgical history.  No family history on file.  Social History   Tobacco Use  . Smoking status: Former Smoker    Packs/day: 0.50    Types: Cigarettes  . Smokeless tobacco: Never Used  Substance Use Topics  . Alcohol use: Yes    Comment: occasional  . Drug use: No    Prior to Admission medications   Not on File    Allergies Patient has no known allergies.   REVIEW OF SYSTEMS  Negative except as noted here or in the History of Present Illness.   PHYSICAL EXAMINATION  Initial Vital Signs Blood pressure 124/71, pulse 91, temperature 99.1 F (37.3 C), temperature source Oral, resp. rate 18, height 5\' 5"  (1.651 m), weight 64.9 kg, last menstrual period 04/16/2020, SpO2 99 %.  Examination General: Well-developed, well-nourished female in no acute distress; appearance consistent with age of record HENT: normocephalic; atraumatic Eyes: pupils equal, round and reactive to light; extraocular muscles  intact Neck: supple Heart: regular rate and rhythm Lungs: clear to auscultation bilaterally Abdomen: soft; nondistended; nontender; bowel sounds present Extremities: No deformity; full range of motion; pulses normal Neurologic: Awake, alert and oriented; motor function intact in all extremities and symmetric; altered sensation in left lumbar region down to left foot with decreased touch sensation and increased sensation of heat; no facial droop Skin: Warm and dry Psychiatric: Normal mood and affect   RESULTS  Summary of this visit's results, reviewed and interpreted by myself:   EKG Interpretation  Date/Time:    Ventricular Rate:    PR Interval:    QRS Duration:   QT Interval:    QTC Calculation:   R Axis:     Text Interpretation:        Laboratory Studies: No results found for this or any previous visit (from the past 24 hour(s)). Imaging Studies: No results found.  ED COURSE and MDM  Nursing notes, initial and subsequent vitals signs, including pulse oximetry, reviewed and interpreted by myself.  Vitals:   04/30/20 2152 04/30/20 2155 05/01/20 0138  BP:  116/76 124/71  Pulse:  95 91  Resp:  14 18  Temp:  99.5 F (37.5 C) 99.1 F (37.3 C)  TempSrc:  Oral Oral  SpO2:  100% 99%  Weight: 64.9 kg    Height: 5\' 5"  (1.651 m)     Medications - No data to display  Symptoms are concerning  for neuropathic condition.  It is not consistent with sciatica or lumbar radiculopathy.  She likely would benefit from an MRI of the lumbar spine but she was advised that this would need to be arranged through her PCP.  Given her swelling after travel 3 weeks ago we will arrange for a Doppler ultrasound of her left lower extremity to rule out DVT in the meantime.  PROCEDURES  Procedures   ED DIAGNOSES     ICD-10-CM   1. Left leg paresthesias  R20.2   2. Allodynia  R20.8        Paula Libra, MD 05/01/20 [phone]

## 2020-05-01 NOTE — ED Provider Notes (Signed)
Patient returned for follow-up DVT ultrasound of her left lower extremity.  I personally reviewed her work-up, and reviewed her DVT study which is negative for DVT in the left calf.  I discussed this with the patient, patient is overall reassured by the findings.  As mentioned by the previous provider, I reiterated that she should follow-up with her primary care doctor for possible outpatient MRI.  She has had no new or worsening symptoms.  She voices understanding is agreeable.   Mare Ferrari, PA-C 05/01/20 1132    Little, Ambrose Finland, MD 05/02/20 [phone]

## 2021-05-02 ENCOUNTER — Emergency Department (HOSPITAL_BASED_OUTPATIENT_CLINIC_OR_DEPARTMENT_OTHER)
Admission: EM | Admit: 2021-05-02 | Discharge: 2021-05-02 | Disposition: A | Discharge status: Home / Self Care | Payer: BLUE CROSS/BLUE SHIELD | Attending: Emergency Medicine | Admitting: Emergency Medicine

## 2021-05-02 ENCOUNTER — Encounter (HOSPITAL_BASED_OUTPATIENT_CLINIC_OR_DEPARTMENT_OTHER): Payer: Self-pay | Admitting: *Deleted

## 2021-05-02 ENCOUNTER — Emergency Department (HOSPITAL_BASED_OUTPATIENT_CLINIC_OR_DEPARTMENT_OTHER): Payer: BLUE CROSS/BLUE SHIELD

## 2021-05-02 ENCOUNTER — Other Ambulatory Visit: Payer: Self-pay

## 2021-05-02 DIAGNOSIS — R102 Pelvic and perineal pain: Secondary | ICD-10-CM

## 2021-05-02 DIAGNOSIS — Z87891 Personal history of nicotine dependence: Secondary | ICD-10-CM | POA: Insufficient documentation

## 2021-05-02 DIAGNOSIS — K529 Noninfective gastroenteritis and colitis, unspecified: Secondary | ICD-10-CM | POA: Insufficient documentation

## 2021-05-02 DIAGNOSIS — R1030 Lower abdominal pain, unspecified: Secondary | ICD-10-CM | POA: Diagnosis present

## 2021-05-02 LAB — URINALYSIS, COMPLETE (UACMP) WITH MICROSCOPIC
Glucose, UA: NEGATIVE mg/dL
Ketones, ur: >80 mg/dL — AB
Leukocytes,Ua: NEGATIVE
Nitrite: NEGATIVE
Protein, ur: 30 mg/dL — AB
Specific Gravity, Urine: >1.03 — ABNORMAL HIGH (ref 1.005–1.030)
pH: 6 (ref 5.0–8.0)

## 2021-05-02 LAB — COMPREHENSIVE METABOLIC PANEL
ALT: 15 U/L (ref 0–44)
AST: 21 U/L (ref 15–41)
Albumin: 4 g/dL (ref 3.5–5.0)
Alkaline Phosphatase: 54 U/L (ref 38–126)
Anion gap: 8 (ref 5–15)
BUN: 13 mg/dL (ref 6–20)
CO2: 26 mmol/L (ref 22–32)
Calcium: 8.7 mg/dL — ABNORMAL LOW (ref 8.9–10.3)
Chloride: 102 mmol/L (ref 98–111)
Creatinine, Ser: 0.81 mg/dL (ref 0.44–1.00)
GFR, Estimated: >60 mL/min (ref >60)
Glucose, Bld: 146 mg/dL — ABNORMAL HIGH (ref 70–99)
Potassium: 3.2 mmol/L — ABNORMAL LOW (ref 3.5–5.1)
Sodium: 136 mmol/L (ref 135–145)
Total Bilirubin: 0.3 mg/dL (ref 0.3–1.2)
Total Protein: 7.5 g/dL (ref 6.5–8.1)

## 2021-05-02 LAB — CBC WITH DIFFERENTIAL/PLATELET
Abs Immature Granulocytes: 0.03 10*3/uL (ref 0.00–0.07)
Basophils Absolute: 0 10*3/uL (ref 0.0–0.1)
Basophils Relative: 0 %
Eosinophils Absolute: 0 10*3/uL (ref 0.0–0.5)
Eosinophils Relative: 0 %
HCT: 39.1 % (ref 36.0–46.0)
Hemoglobin: 13.2 g/dL (ref 12.0–15.0)
Immature Granulocytes: 0 %
Lymphocytes Relative: 15 %
Lymphs Abs: 1.7 10*3/uL (ref 0.7–4.0)
MCH: 29.1 pg (ref 26.0–34.0)
MCHC: 33.8 g/dL (ref 30.0–36.0)
MCV: 86.1 fL (ref 80.0–100.0)
Monocytes Absolute: 0.4 10*3/uL (ref 0.1–1.0)
Monocytes Relative: 4 %
Neutro Abs: 9 10*3/uL — ABNORMAL HIGH (ref 1.7–7.7)
Neutrophils Relative %: 81 %
Platelets: 364 10*3/uL (ref 150–400)
RBC: 4.54 MIL/uL (ref 3.87–5.11)
RDW: 13.9 % (ref 11.5–15.5)
WBC: 11.2 10*3/uL — ABNORMAL HIGH (ref 4.0–10.5)
nRBC: 0 % (ref 0.0–0.2)

## 2021-05-02 LAB — WET PREP, GENITAL
Sperm: NONE SEEN
Trich, Wet Prep: NONE SEEN
Yeast Wet Prep HPF POC: NONE SEEN

## 2021-05-02 LAB — PREGNANCY, URINE: Preg Test, Ur: NEGATIVE

## 2021-05-02 MED ORDER — CIPROFLOXACIN HCL 500 MG PO TABS: 500.0000 mg | ORAL_TABLET | Freq: Two times a day (BID) | ORAL | 0 refills | Status: DC

## 2021-05-02 MED ORDER — OXYCODONE-ACETAMINOPHEN 5-325 MG PO TABS: 1.0000 | ORAL_TABLET | Freq: Four times a day (QID) | ORAL | 0 refills | Status: DC | PRN

## 2021-05-02 MED ORDER — MORPHINE SULFATE (PF) 4 MG/ML IV SOLN
4.0000 mg | Freq: Once | INTRAVENOUS | Status: AC
  Administered 2021-05-02: 4 mg via INTRAVENOUS
  Filled 2021-05-02: qty 1

## 2021-05-02 MED ORDER — KETOROLAC TROMETHAMINE 15 MG/ML IJ SOLN
15.0000 mg | Freq: Once | INTRAMUSCULAR | Status: AC
  Administered 2021-05-02: 15 mg via INTRAVENOUS
  Filled 2021-05-02: qty 1

## 2021-05-02 MED ORDER — ONDANSETRON HCL 4 MG/2ML IJ SOLN
4.0000 mg | Freq: Once | INTRAMUSCULAR | Status: AC
  Administered 2021-05-02: 4 mg via INTRAVENOUS
  Filled 2021-05-02: qty 2

## 2021-05-02 MED ORDER — METRONIDAZOLE 500 MG PO TABS
500.0000 mg | ORAL_TABLET | Freq: Once | ORAL | Status: AC
  Administered 2021-05-02: 500 mg via ORAL
  Filled 2021-05-02: qty 1

## 2021-05-02 MED ORDER — ONDANSETRON 4 MG PO TBDP: 4.0000 mg | ORAL_TABLET | Freq: Three times a day (TID) | ORAL | 0 refills | Status: DC | PRN

## 2021-05-02 MED ORDER — CIPROFLOXACIN HCL 500 MG PO TABS
500.0000 mg | ORAL_TABLET | Freq: Once | ORAL | Status: AC
  Administered 2021-05-02: 500 mg via ORAL
  Filled 2021-05-02: qty 1

## 2021-05-02 MED ORDER — METRONIDAZOLE 500 MG PO TABS: 500.0000 mg | ORAL_TABLET | Freq: Three times a day (TID) | ORAL | 0 refills | Status: DC

## 2021-05-02 MED ORDER — IOHEXOL 300 MG/ML  SOLN
100.0000 mL | Freq: Once | INTRAMUSCULAR | Status: AC | PRN
  Administered 2021-05-02: 100 mL via INTRAVENOUS

## 2021-05-02 NOTE — ED Triage Notes (Signed)
3 days of lower abdominal cramping with 'spotting'.  Pt has an IUD in place.

## 2021-05-02 NOTE — ED Provider Notes (Signed)
[facility] EMERGENCY DEPARTMENT Provider Note   CSN: [id number] Arrival date & time: 05/02/21  1332     History Chief Complaint  Patient presents with   Abdominal Cramping    Sylvia Garcia is a 35 y.o. female.  Patient with no past surgical history, presents to the emergency department for evaluation of lower abdominal cramping.  Her symptoms started about 5 days ago when her period started.  She states that she also had diarrhea at that time which lasted for about 3 days before improving.  No blood in the stool.  Her period has slowed but the pain persists.  She denies fever, vomiting.  No chest pain or shortness of breath.  She has taken over-the-counter medications at home without much improvement.  Pain is worse with movement and palpation.  She has an IUD in place and is concerned that this could be a complication with it. The onset of this condition was acute. The course is constant.       History reviewed. No pertinent past medical history.  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 No pertinent surgical history.   OB History   No obstetric history on file.     History reviewed. No pertinent family history.  Social History   Tobacco Use   Smoking status: Former    Packs/day: 0.50    Pack years: 0.00    Types: Cigarettes   Smokeless tobacco: Never  Substance Use Topics   Alcohol use: Yes    Comment: occasional   Drug use: No    Home Medications Prior to Admission medications   Medication Sig Start Date End Date Taking? Authorizing Provider  amphetamine-dextroamphetamine (ADDERALL XR) 10 MG 24 hr capsule Take 20 mg by mouth daily.   Yes [provider]    Allergies    Patient has no known  allergies.  Review of Systems   Review of Systems  Constitutional:  Negative for fever.  HENT:  Negative for rhinorrhea and sore throat.   Eyes:  Negative for redness.  Respiratory:  Negative for cough.   Cardiovascular:  Negative for chest pain.  Gastrointestinal:  Positive for abdominal pain and diarrhea (resolved). Negative for nausea and vomiting.  Genitourinary:  Positive for pelvic pain and vaginal bleeding. Negative for dysuria, frequency, hematuria and urgency.  Musculoskeletal:  Negative for myalgias.  Skin:  Negative for rash.  Neurological:  Negative for headaches.   Physical Exam Updated Vital Signs BP 106/68 (BP Location: Right Arm)   Pulse 79   Temp 98.2 F (36.8 C) (Oral)   Resp 18   Ht 5\' 5"  (1.651 m)   Wt 67.1 kg   LMP 04/27/2021   SpO2 100%   BMI 24.63 kg/m   Physical Exam Vitals and nursing note reviewed. Exam conducted with a chaperone present.  Constitutional:      General: She is not in acute distress.    Appearance: She is well-developed.  HENT:     Head: Normocephalic and atraumatic.     Right Ear: External ear normal.     Left Ear: External ear normal.     Nose: Nose normal.  Eyes:     Conjunctiva/sclera: Conjunctivae normal.  Cardiovascular:     Rate and Rhythm: Normal rate and  regular rhythm.     Heart sounds: No murmur heard. Pulmonary:     Effort: No respiratory distress.     Breath sounds: No wheezing, rhonchi or rales.  Abdominal:     Palpations: Abdomen is soft.     Tenderness: There is abdominal tenderness. There is no guarding or rebound.     Comments: Bilateral lower abdominal tenderness.  Genitourinary:    Exam position: Lithotomy position.     Labia:        Right: No lesion.        Left: No lesion.      Vagina: Bleeding (mild) present. No vaginal discharge.     Cervix: Cervical bleeding present. No cervical motion tenderness or lesion.     Uterus: Tender.      Adnexa:        Right: Tenderness present.        Left:  Tenderness present.      Comments: IUD strings visualized through cervix. Small amt of vaginal bleeding. No CMT.  Musculoskeletal:     Cervical back: Normal range of motion and neck supple.     Right lower leg: No edema.     Left lower leg: No edema.  Skin:    General: Skin is warm and dry.     Findings: No rash.  Neurological:     General: No focal deficit present.     Mental Status: She is alert. Mental status is at baseline.     Motor: No weakness.  Psychiatric:        Mood and Affect: Mood normal.    ED Results / Procedures / Treatments   Labs (all labs ordered are listed, but only abnormal results are displayed) Labs Reviewed  URINALYSIS, COMPLETE (UACMP) WITH MICROSCOPIC - Abnormal; Notable for the following components:      Result Value   Specific Gravity, Urine >1.030 (*)    Hgb urine dipstick LARGE (*)    Bilirubin Urine SMALL (*)    Ketones, ur >80 (*)    Protein, ur 30 (*)    Bacteria, UA MANY (*)    All other components within normal limits  CBC WITH DIFFERENTIAL/PLATELET - Abnormal; Notable for the following components:   WBC 11.2 (*)    Neutro Abs 9.0 (*)    All other components within normal limits  COMPREHENSIVE METABOLIC PANEL - Abnormal; Notable for the following components:   Potassium 3.2 (*)    Glucose, Bld 146 (*)    Calcium 8.7 (*)    All other components within normal limits  WET PREP, GENITAL  PREGNANCY, URINE  GC/CHLAMYDIA PROBE AMP ([place]) NOT AT [facility]    EKG None  Radiology CT ABDOMEN PELVIS W CONTRAST  Result Date: 05/02/2021 CLINICAL DATA:  Bilateral lower abdominal pain, unremarkable pelvic ultrasound EXAM: CT ABDOMEN AND PELVIS WITH CONTRAST TECHNIQUE: Multidetector CT imaging of the abdomen and pelvis was performed using the standard protocol following bolus administration of intravenous contrast. CONTRAST:  OMNIPAQUE IOHEXOL 300 MG/ML  SOLN COMPARISON:  11/03/2012 FINDINGS: Lower chest: Lung bases are clear. Normal heart  size. No pericardial effusion. Hepatobiliary: Fatty infiltration noted along the falciform ligament. No concerning focal liver lesion. Smooth liver surface contour. Normal hepatic enhancement. Normal gallbladder and biliary tree without visible calcified gallstones. Pancreas: No pancreatic ductal dilatation or surrounding inflammatory changes. Spleen: Normal in size. No concerning splenic lesions. Adrenals/Urinary Tract: Normal adrenal glands. Kidneys are normally located with symmetric enhancement small cyst versus scarring in the  upper pole left kidney. No suspicious renal lesion, urolithiasis or hydronephrosis. Urinary bladder is unremarkable for the degree of distention. Stomach/Bowel: Distal esophagus, stomach and duodenal sweep are unremarkable. No small bowel wall thickening or dilatation. No evidence of obstruction. Noninflamed appendix in the right lower quadrant coursing along the pelvic sidewall. There is fairly diffuse colonic wall thickening albeit with more focal and irregular mural thickening within the mid sigmoid colon. While several scattered noninflamed colonic diverticula are present in the mid to distal colon, a clear culprit diverticulum is not discernibly identified. No extraluminal gas or organized collection or abscess is seen. No resulting obstruction is evident. Vascular/Lymphatic: No significant vascular findings are present. No enlarged abdominal or pelvic lymph nodes. Reproductive: Anteverted uterus. Radiodense IUD within expected location. Normal follicles in the ovaries, better assessed on comparison pelvic ultrasound. Other: Inflammatory changes in stranding centered upon the irregularly thickened sigmoid colon, as above. Some trace layering free fluid in the deep pelvis may be reactive and/or physiologic. No free air. No organized abscess or collection. Musculoskeletal: No acute osseous abnormality or suspicious osseous lesion. IMPRESSION: Background of mild, fairly diffuse  pancolonic mural thickening could reflect a colitis however there is a significant more pronounced focal and irregular region of mural thickening in the proximal to mid sigmoid colon with extensive surrounding pericolonic inflammatory change. This does not appear to be centered upon a culprit diverticulum. Could reflect more focal segmental colitis though given the irregularity, should warrant further evaluation with direct visualization as clinically available. Additional non inflamed colonic diverticula seen elsewhere throughout the colon. Geographic focal hepatic fatty infiltration along the falciform ligament. These results were called by telephone at the time of interpretation on 05/02/2021 at 5:42 pm to provider [facility] , who verbally acknowledged these results. Electronically Signed   By: Kreg Shropshire M.D.   On: 05/02/2021 17:43   US PELVIC COMPLETE W TRANSVAGINAL AND TORSION R/O  Result Date: 05/02/2021 CLINICAL DATA:  Pelvic pain for 3 days EXAM: TRANSABDOMINAL AND TRANSVAGINAL ULTRASOUND OF PELVIS DOPPLER ULTRASOUND OF OVARIES TECHNIQUE: Both transabdominal and transvaginal ultrasound examinations of the pelvis were performed. Transabdominal technique was performed for global imaging of the pelvis including uterus, ovaries, adnexal regions, and pelvic cul-de-sac. It was necessary to proceed with endovaginal exam following the transabdominal exam to visualize the uterus, endometrium, ovaries, and adnexa. Color and duplex Doppler ultrasound was utilized to evaluate blood flow to the ovaries. COMPARISON:  None. FINDINGS: Uterus Measurements: 7.0 x 3.6 x 4.3 cm = volume: 56 mL. No fibroids or other mass visualized. IUD present in the fundal endometrial cavity. Endometrium Thickness: 4 mm.  No focal abnormality visualized. Right ovary Measurements: 3.6 x 2.7 x 3.3 cm = volume: 17 mL. Normal appearance/no adnexal mass. Subcentimeter follicle. Left ovary Measurements: 2.6 x 1.4 x 1.8 cm = volume: 3 mL.  Normal appearance/no adnexal mass. Multiple subcentimeter follicles. Pulsed Doppler evaluation of both ovaries demonstrates normal low-resistance arterial and venous waveforms. Other findings Trace free fluid in the low pelvis. IMPRESSION: 1. No ultrasound abnormality of the pelvis to explain pelvic pain. 2. IUD present in the fundal endometrial cavity. 3. Trace free fluid in the low pelvis, likely functional in the reproductive age setting. Electronically Signed   By: Lauralyn Primes M.D.   On: 05/02/2021 16:36    Procedures Procedures   Medications Ordered in ED Medications - No data to display  ED Course  I have reviewed the triage vital signs and the nursing notes.  Pertinent labs &  imaging results that were available during my care of the patient were reviewed by me and considered in my medical decision making (see chart for details).  Patient seen and examined. Work-up initiated. Labs, will need pelvic exam and ultrasound.    Vital signs reviewed and are as follows: BP 117/83   Pulse 76   Temp 98.2 F (36.8 C) (Oral)   Resp 18   Ht 5\' 5"  (1.651 m)   Wt 67.1 kg   LMP 04/27/2021   SpO2 100%   BMI 24.63 kg/m   3:34 PM Labs reviewed. Pelvic performed with RN chaperone. Will proceed with 04/29/2021.   4:58 PM fortunately work-up to this point is reassuring.  Discussed these findings with the patient including, marginally elevated white blood cell count.  Discussed options of symptom control and close outpatient follow-up versus additional evaluation with CT of the abdomen and pelvis to evaluate for a lower abdominal etiology.  Patient would like to proceed with CT after discussion of risks and benefits.  She was given Toradol for pain earlier.  BP 117/83   Pulse 76   Temp 98.2 F (36.8 C) (Oral)   Resp 18   Ht 5\' 5"  (1.651 m)   Wt 67.1 kg   LMP 04/27/2021   SpO2 100%   BMI 24.63 kg/m   6:04 PM CT demonstrates colitis.  Radiologist called me and strongly encourages GI follow-up to  determine if colonoscopy will be needed to evaluate for mass or other potential etiology of the colitis.  Reviewed findings with patient and mother now at bedside.  She is more comfortable after the morphine.  Will start on Cipro and Flagyl.  We will give outpatient GI follow-up and also encourage PCP follow-up to ensure resolution of symptoms.  Encouraged rest over the next several days.  Patient counseled on use of narcotic pain medications. Counseled not to combine these medications with others containing tylenol. Urged not to drink alcohol, drive, or perform any other activities that requires focus while taking these medications. The patient verbalizes understanding and agrees with the plan.  The patient was urged to return to the Emergency Department immediately with worsening of current symptoms, worsening abdominal pain, persistent vomiting, blood noted in stools, fever, or any other concerns. The patient verbalized understanding.      MDM Rules/Calculators/A&P                          Patient with lower abdominal pain.  Pelvic ultrasound reassuring.  CT shows signs of colitis without complications, however there is a large amount of inflammation and inflammatory change and patient may need colonoscopy as an outpatient.  We will have patient follow-up with GI for further advice regarding this.  Return instructions as above.  Pain is controlled.  Patient appears well, nontoxic.  Plan for discharged home with pain control, antibiotics, follow-up.    Final Clinical Impression(s) / ED Diagnoses Final diagnoses:  Colitis    Rx / DC Orders ED Discharge Orders          Ordered    ciprofloxacin (CIPRO) 500 MG tablet  2 times daily        05/02/21 1803    metroNIDAZOLE (FLAGYL) 500 MG tablet  3 times daily        05/02/21 1803    oxyCODONE-acetaminophen (PERCOCET/ROXICET) 5-325 MG tablet  Every 6 hours PRN        05/02/21 1803  Renne Crigler, PA-C 05/02/21 1807     Pollyann Savoy, MD 05/03/21 [phone]

## 2021-05-02 NOTE — Discharge Instructions (Addendum)
Please read and follow all provided instructions.  Your diagnoses today include:  1. Colitis   2. Pelvic pain     Tests performed today include: Blood cell counts and platelets - slightly high white blood cells Kidney and liver function tests Urine test to look for infection A blood or urine test for pregnancy (women only) Ultrasound of the pelvis - no concerning findings CT of the abdomen and pelvis - shows colitis Vital signs. See below for your results today.   Medications prescribed:  Ciprofloxacin - antibiotic  You have been prescribed an antibiotic medicine: take the entire course of medicine even if you are feeling better. Stopping early can cause the antibiotic not to work.  Metronidazole - antibiotic  You have been prescribed an antibiotic medicine: take the entire course of medicine even if you are feeling better. Stopping early can cause the antibiotic not to work. Do not drink alcohol when taking this medication.   Percocet (oxycodone/acetaminophen) - narcotic pain medication  DO NOT drive or perform any activities that require you to be awake and alert because this medicine can make you drowsy. BE VERY CAREFUL not to take multiple medicines containing Tylenol (also called acetaminophen). Doing so can lead to an overdose which can damage your liver and cause liver failure and possibly death.   Take any prescribed medications only as directed.  Home care instructions:  Follow any educational materials contained in this packet.  Follow-up instructions: Please follow-up with your primary care provider in the next 3 days for further evaluation of your symptoms and a gastroenterologist in the next 1 to 2 weeks for follow-up.  Have them review your imaging and see if they would recommend a colonoscopy.  Return instructions:  SEEK IMMEDIATE MEDICAL ATTENTION IF: The pain does not go away or becomes severe  A temperature above 101F develops  Repeated vomiting occurs  (multiple episodes)  The pain becomes localized to portions of the abdomen. The right side could possibly be appendicitis. In an adult, the left lower portion of the abdomen could be colitis or diverticulitis.  Blood is being passed in stools or vomit (bright red or black tarry stools)  You develop chest pain, difficulty breathing, dizziness or fainting, or become confused, poorly responsive, or inconsolable (young children) If you have any other emergent concerns regarding your health  Additional Information: Abdominal (belly) pain can be caused by many things. Your caregiver performed an examination and possibly ordered blood/urine tests and imaging (CT scan, x-rays, ultrasound). Many cases can be observed and treated at home after initial evaluation in the emergency department. Even though you are being discharged home, abdominal pain can be unpredictable. Therefore, you need a repeated exam if your pain does not resolve, returns, or worsens. Most patients with abdominal pain don't have to be admitted to the hospital or have surgery, but serious problems like appendicitis and gallbladder attacks can start out as nonspecific pain. Many abdominal conditions cannot be diagnosed in one visit, so follow-up evaluations are very important.  Your vital signs today were: BP 113/70 (BP Location: Left Arm)   Pulse 78   Temp 98.2 F (36.8 C) (Oral)   Resp 18   Ht 5\' 5"  (1.651 m)   Wt 67.1 kg   LMP 04/27/2021   SpO2 100%   BMI 24.63 kg/m  If your blood pressure (bp) was elevated above 135/85 this visit, please have this repeated by your doctor within one month. --------------

## 2021-05-03 LAB — GC/CHLAMYDIA PROBE AMP (~~LOC~~) NOT AT ARMC
Chlamydia: NEGATIVE
Comment: NEGATIVE
Comment: NORMAL
Neisseria Gonorrhea: NEGATIVE

## 2022-06-13 ENCOUNTER — Ambulatory Visit: Payer: Self-pay | Admitting: General Surgery

## 2022-06-13 NOTE — H&P (Signed)
REFERRING PHYSICIAN:  Eleanora Neighbor, MD   PROVIDER:  Elenora Gamma, MD   MRN: [id number] DOB: 11/23/1985 DATE OF ENCOUNTER: 06/13/2022   Subjective    Chief Complaint: Diverticulitis       History of Present Illness: Sylvia Garcia is a 36 y.o. female who is seen today as an office consultation at the request of Dr. Cloretta Ned for evaluation of Diverticulitis .  Patient with history of recurrent diverticulitis.  Most recent bout was in April 2023 and complicated by abscess, treated with IV antibiotics.  First bout of diverticulitis was in July 2022.  This resolved with antibiotics.  Another episode occurred in August and then in November.  In April she required hospitalization for 1 week due to abscess.  Colonoscopy in February 2023 showed left-sided diverticulosis with no malignancy.  TI was intubated and felt to be normal as well.       Review of Systems: A complete review of systems was obtained from the patient.  I have reviewed this information and discussed as appropriate with the patient.  See HPI as well for other ROS.     Medical History: Past Medical History  History reviewed. No pertinent past medical history.     There is no problem list on file for this patient.     Past Surgical History  History reviewed. No pertinent surgical history.      Allergies  Not on File           Current Outpatient Medications on File Prior to Visit  Medication Sig Dispense Refill   acyclovir (ZOVIRAX) 400 MG tablet Take 400 mg by mouth 2 (two) times daily       dextroamphetamine-amphetamine (ADDERALL XR) 10 MG XR capsule Take 20 mg by mouth once daily        No current facility-administered medications on file prior to visit.      Family History       Family History  Problem Relation Age of Onset   High blood pressure (Hypertension) Mother     Heart valve disease Mother     Deep vein thrombosis (DVT or abnormal blood clot formation) Father          Social  History        Tobacco Use  Smoking Status Every Day   Types: Cigarettes  Smokeless Tobacco Never      Social History  Social History         Socioeconomic History   Marital status: Unknown  Tobacco Use   Smoking status: Every Day      Types: Cigarettes   Smokeless tobacco: Never  Vaping Use   Vaping Use: Never used  Substance and Sexual Activity   Alcohol use: Yes   Drug use: Never        Objective:         Vitals:    06/13/22 1032  BP: 122/78  Pulse: 97  Temp: 37.3 C (99.2 F)  SpO2: 96%  Weight: 64.3 kg (141 lb 12.8 oz)  Height: 165.1 cm (5\' 5" )      Exam Gen: NAD CV: RRR Lungs: CTA Abd: soft       Labs, Imaging and Diagnostic Testing: CT reviewed.  Patient has a persistently thickened sigmoid colon.   Assessment and Plan:  Diagnoses and all orders for this visit:   Diverticular disease     36 year old female with recurrent diverticulitis and history of complicated diverticulitis in April 2023.  After discussing this with her  and reviewing her CT images and history, I think it would be reasonable to perform sigmoidectomy to help prevent future complications of her diverticular disease.  We have discussed this in detail including risk of bleeding, infection and damage to adjacent structures.  We also discussed the risk of fertility issues with pelvic surgery.  All questions were answered.  Patient has a trip to [place] scheduled in October and would like to schedule surgery after returning from this.  We will work to accommodate her.  I asked her to call the office if she develops any flareups between now and then.   The surgery and anatomy were described to the patient as well as the risks of surgery and the possible complications.  These include: Bleeding, deep abdominal infections and possible wound complications such as hernia and infection, damage to adjacent structures, leak of surgical connections, which can lead to other surgeries and possibly an  ostomy, possible need for other procedures, such as abscess drains in radiology, possible prolonged hospital stay, possible diarrhea from removal of part of the colon, possible constipation from narcotics, possible bowel, bladder or sexual dysfunction if having rectal surgery, prolonged fatigue/weakness or appetite loss, possible early recurrence of of disease, possible complications of their medical problems such as heart disease or arrhythmias or lung problems, death (less than 1%). I believe the patient understands and wishes to proceed with the surgery.

## 2022-07-26 ENCOUNTER — Other Ambulatory Visit: Payer: Self-pay | Admitting: Urology

## 2022-07-27 IMAGING — US US PELVIS COMPLETE TRANSABD/TRANSVAG W DUPLEX
1 series · 13 of 25 positions shown · non-contrast
Comparison: None.

CLINICAL DATA: Pelvic pain for 3 days

EXAM:
TRANSABDOMINAL AND TRANSVAGINAL ULTRASOUND OF PELVIS
DOPPLER ULTRASOUND OF OVARIES
TECHNIQUE: Both transabdominal and transvaginal ultrasound examinations of the
pelvis were performed. Transabdominal technique was performed for
global imaging of the pelvis including uterus, ovaries, adnexal
regions, and pelvic cul-de-sac.
It was necessary to proceed with endovaginal exam following the
transabdominal exam to visualize the uterus, endometrium, ovaries,
and adnexa. Color and duplex Doppler ultrasound was utilized to
evaluate blood flow to the ovaries.

[Series 1: us pelvis complete transabd/transvag w duplex · 13 of 90 slices shown]
[im 1/90]
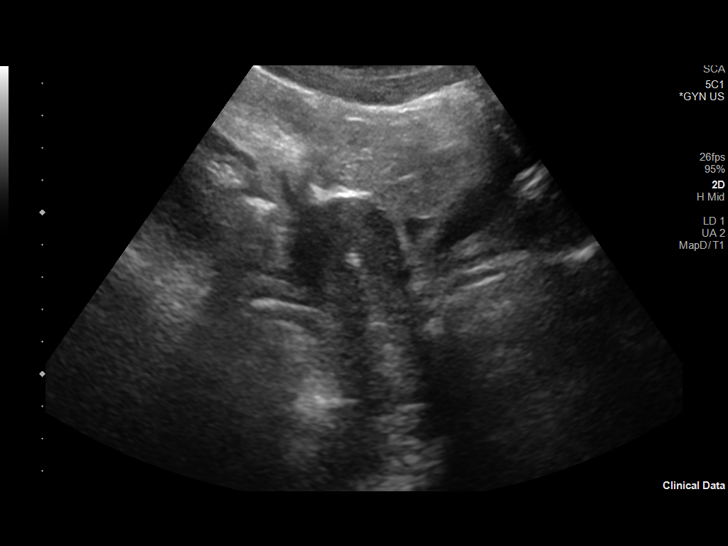
[im 8/90]
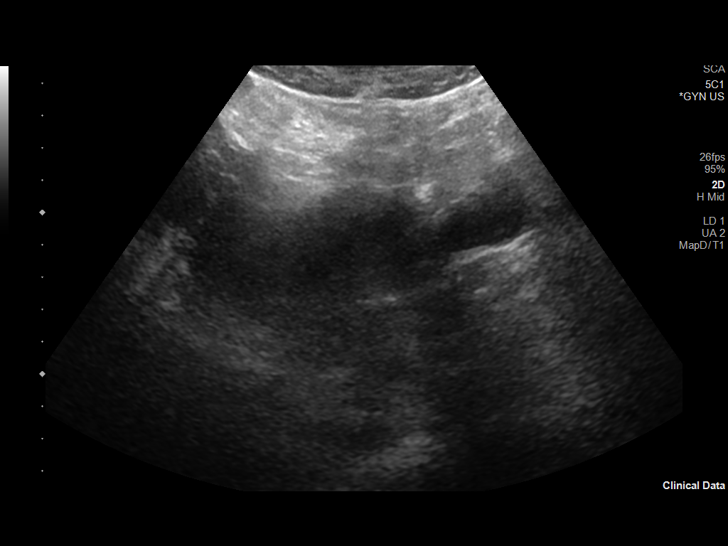
[im 15/90]
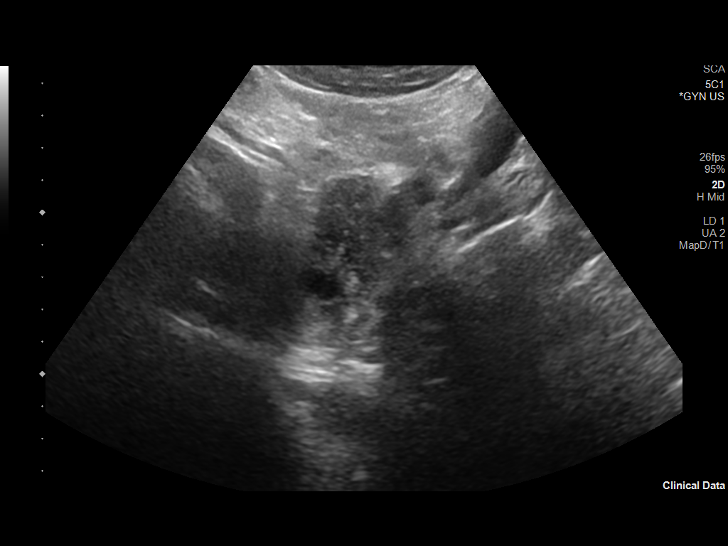
[im 23/90]
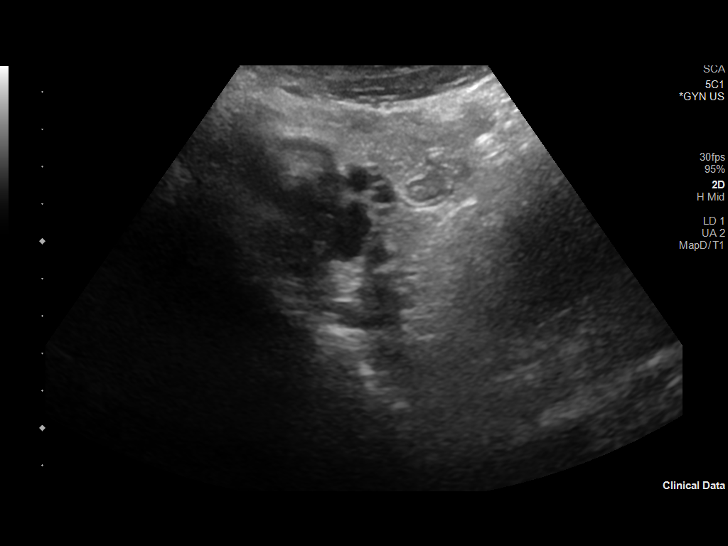
[im 30/90]
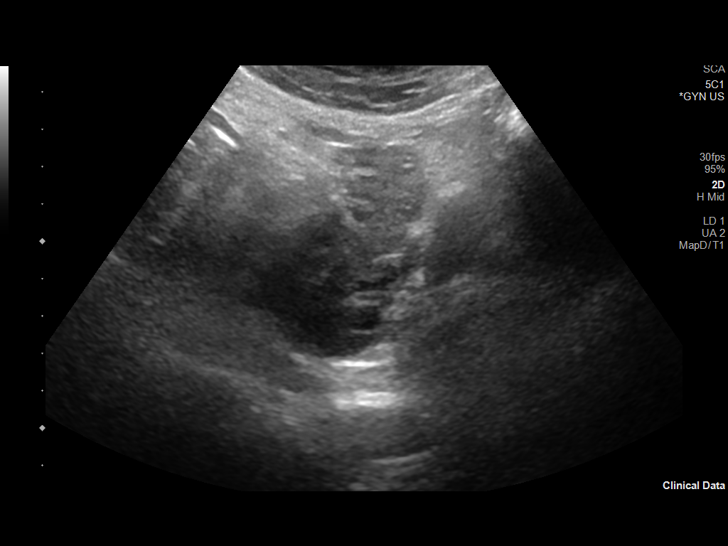
[im 38/90]
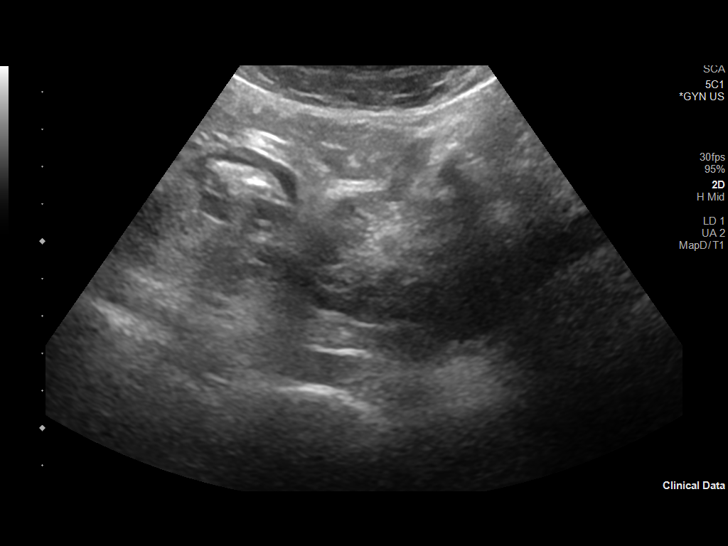
[im 45/90]
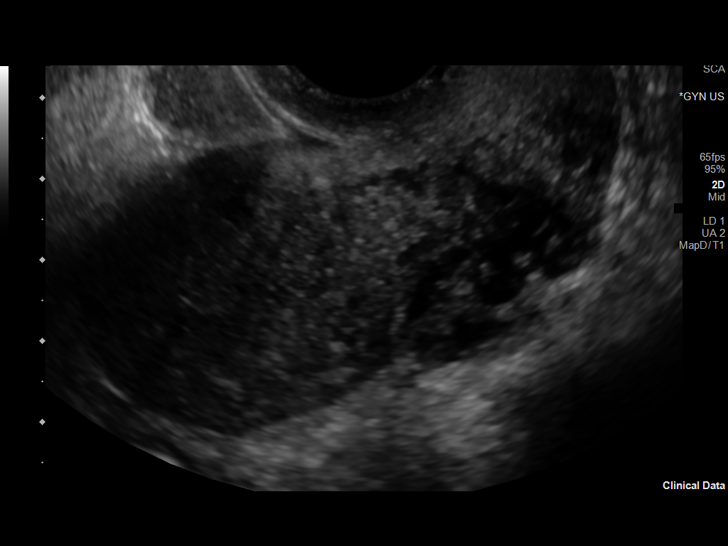
[im 52/90]
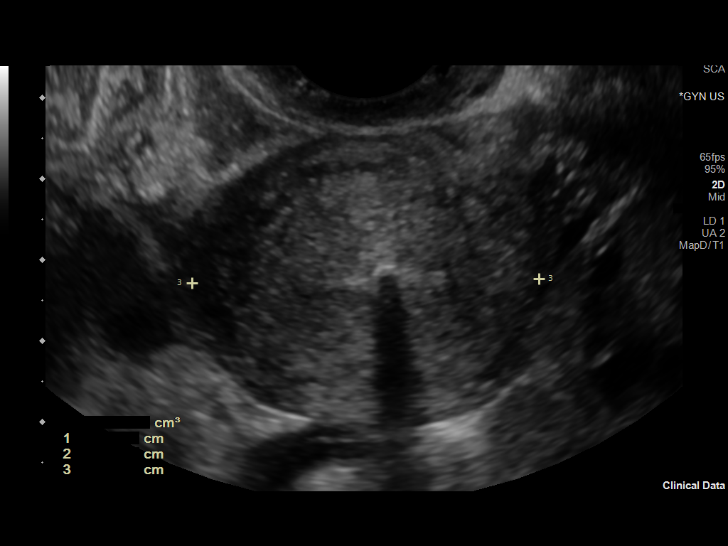
[im 60/90]
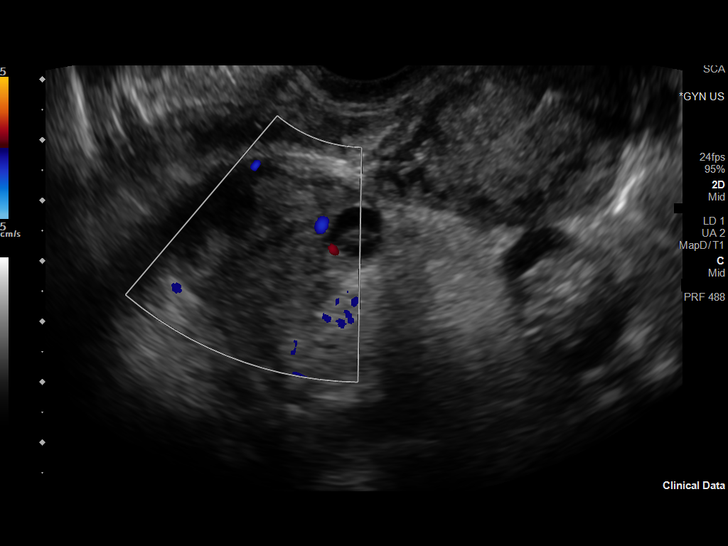
[im 67/90]
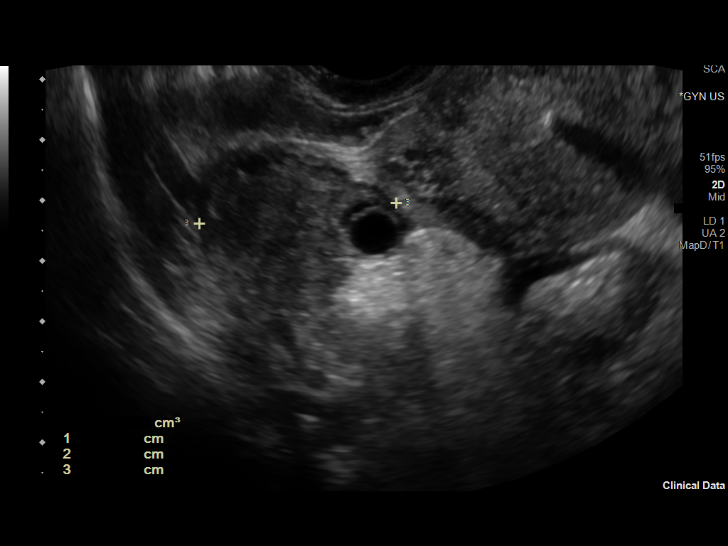
[im 75/90]
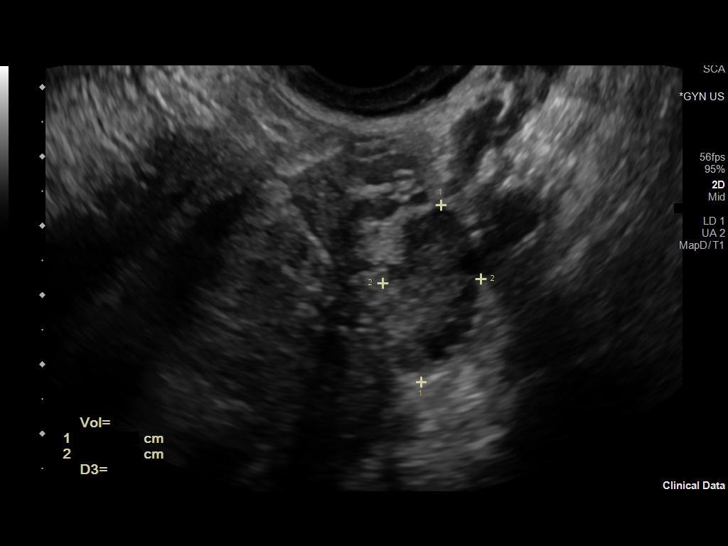
[im 82/90]
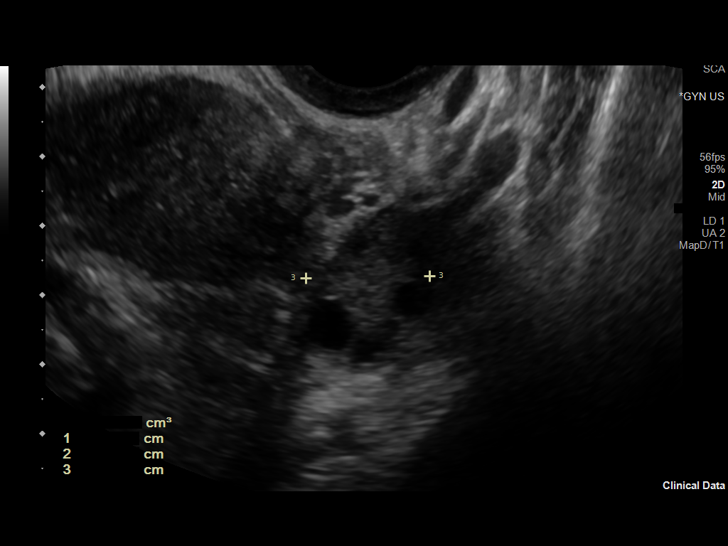
[im 90/90]
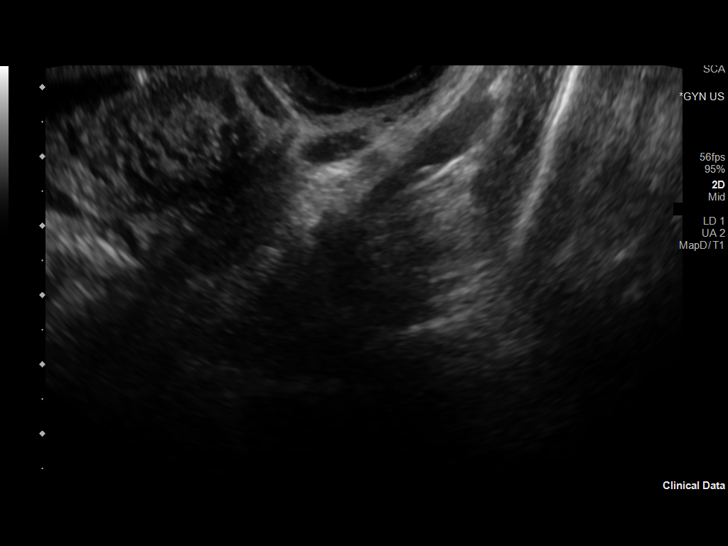

[13 of 25 positions shown; findings below may reference images not displayed]

FINDINGS: Uterus

Measurements: 7.0 x 3.6 x 4.3 cm = volume: 56 mL. No fibroids or
other mass visualized. IUD present in the fundal endometrial cavity.

Endometrium

Thickness: 4 mm.  No focal abnormality visualized.

Right ovary

Measurements: 3.6 x 2.7 x 3.3 cm = volume: 17 mL. Normal
appearance/no adnexal mass. Subcentimeter follicle.

Left ovary

Measurements: 2.6 x 1.4 x 1.8 cm = volume: 3 mL. Normal
appearance/no adnexal mass. Multiple subcentimeter follicles.

Pulsed Doppler evaluation of both ovaries demonstrates normal
low-resistance arterial and venous waveforms.

Other findings

Trace free fluid in the low pelvis.
IMPRESSION: 1. No ultrasound abnormality of the pelvis to explain pelvic pain.
2. IUD present in the fundal endometrial cavity.
3. Trace free fluid in the low pelvis, likely functional in the
reproductive age setting.

## 2022-07-27 IMAGING — CT CT ABD-PELV W/ CM
2 of 4 series · 15 of 46 positions shown, 17 images · IV contrast (Omnipaque)
Comparison: 11/03/2012

CLINICAL DATA: Bilateral lower abdominal pain, unremarkable pelvic
ultrasound

EXAM:
CT ABDOMEN AND PELVIS WITH CONTRAST
TECHNIQUE: Multidetector CT imaging of the abdomen and pelvis was performed
using the standard protocol following bolus administration of
intravenous contrast.
CONTRAST:  100mL OMNIPAQUE IOHEXOL 300 MG/ML  SOLN

[Series 2: axial st · axial · 0.74mm/px · z∈[-466,-116]mm · 12 of 82 slices shown, 14 images]
[im 6/82  soft-tissue]
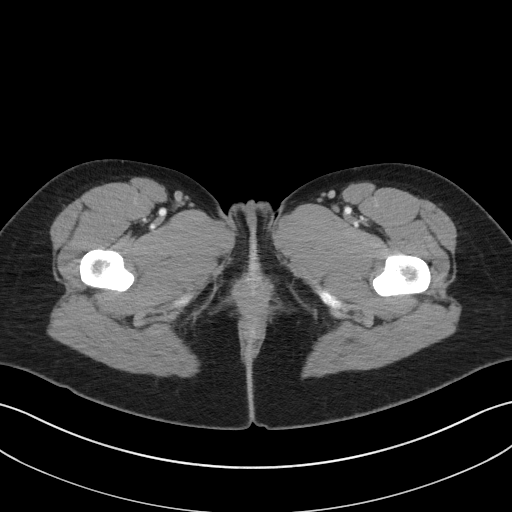
[im 6/82  bone]
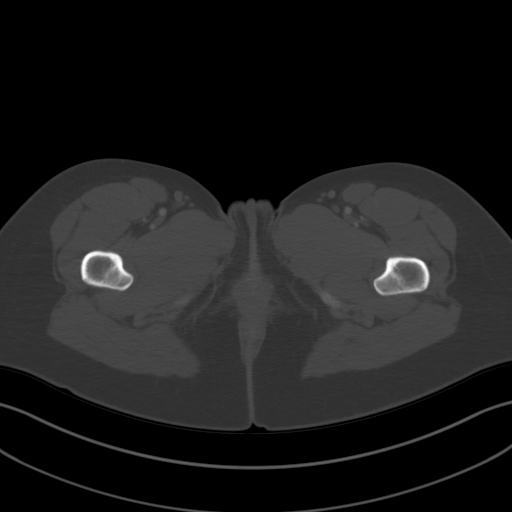
[im 12/82  soft-tissue]
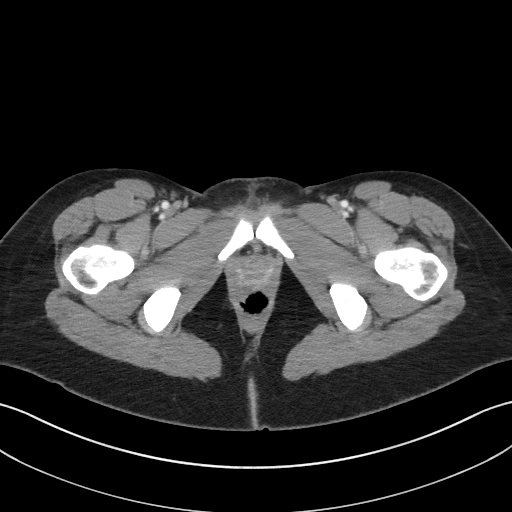
[im 18/82  soft-tissue]
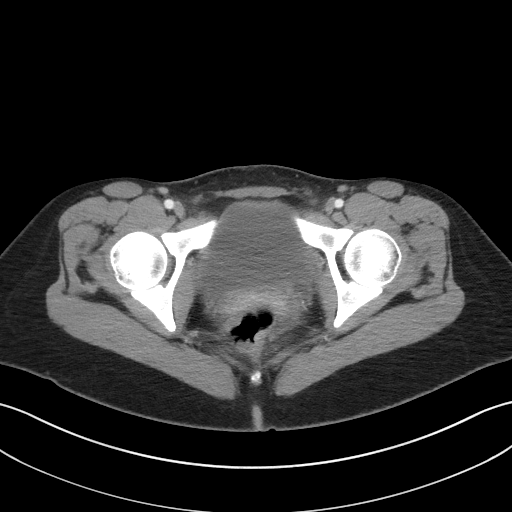
[im 24/82  soft-tissue]
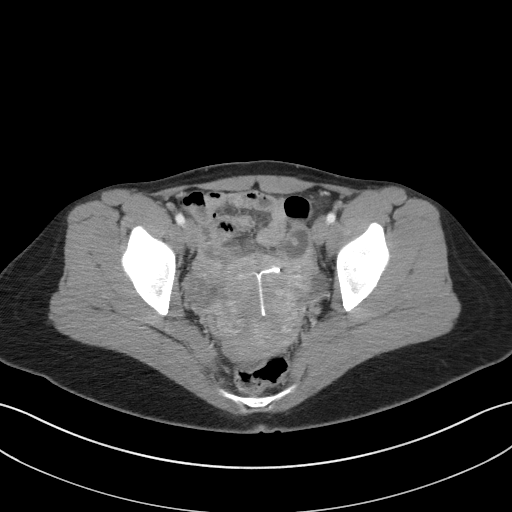
[im 32/82  soft-tissue]
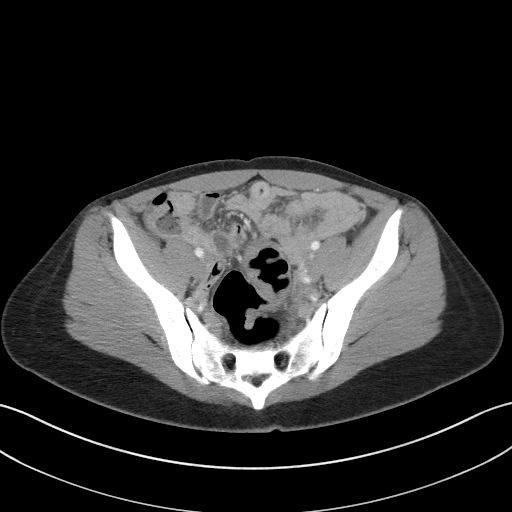
[im 38/82  soft-tissue]
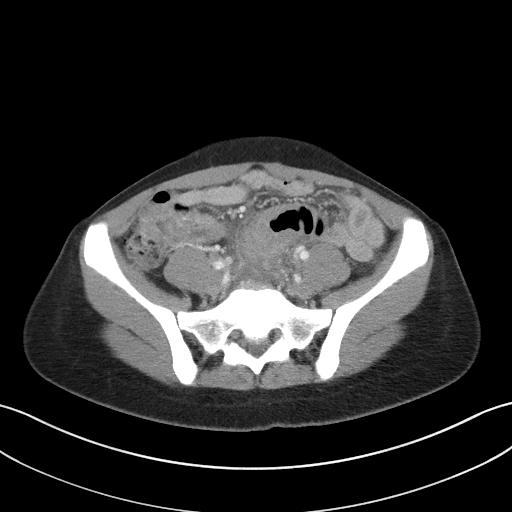
[im 44/82  soft-tissue]
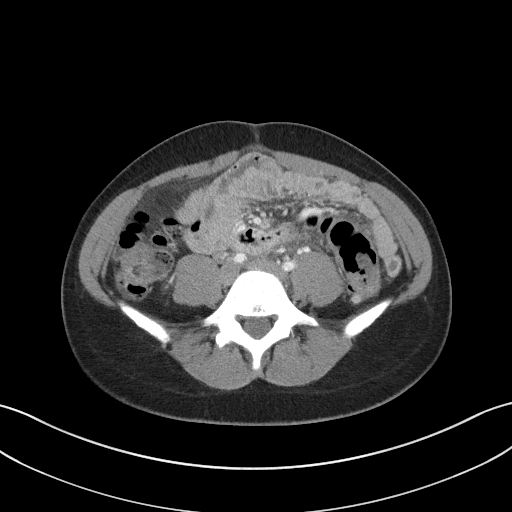
[im 50/82  soft-tissue]
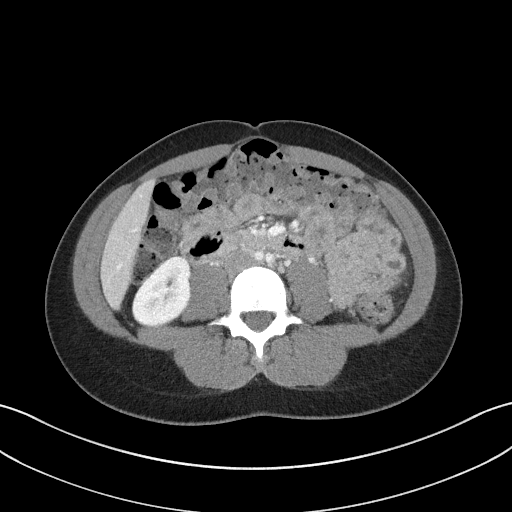
[im 58/82  soft-tissue]
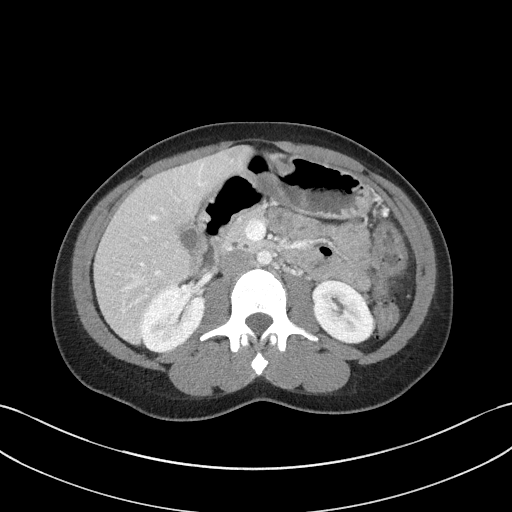
[im 58/82  bone]
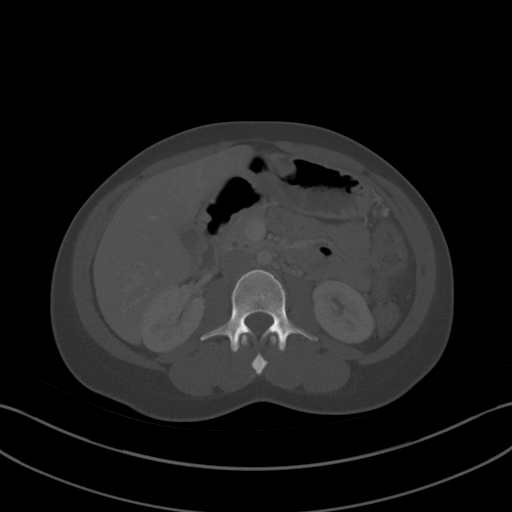
[im 64/82  soft-tissue]
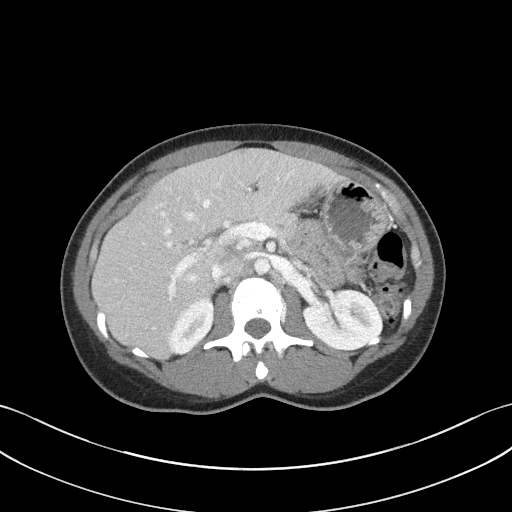
[im 70/82  soft-tissue]
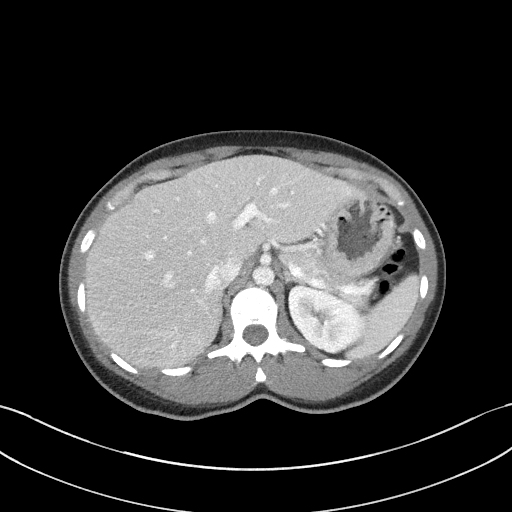
[im 76/82  soft-tissue]
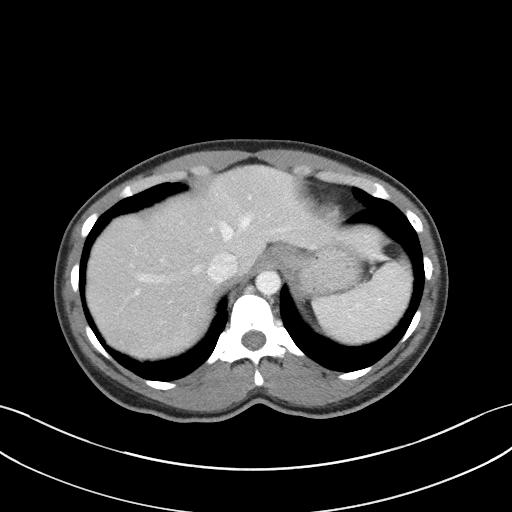

[Series 5: coronal st · coronal · 0.75mm/px · 3 of 78 slices shown]
[im 26/78  soft-tissue]
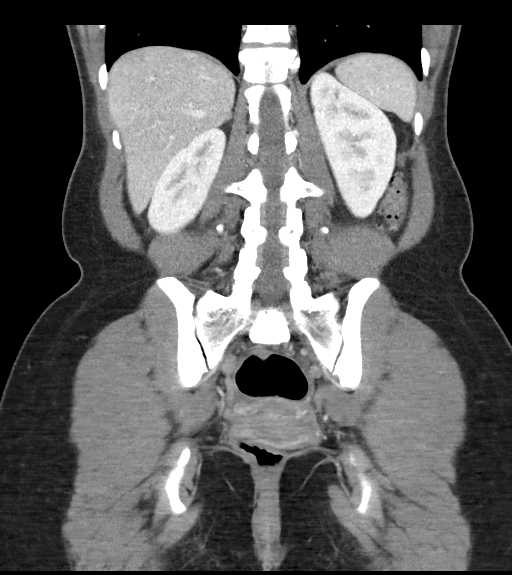
[im 35/78  soft-tissue]
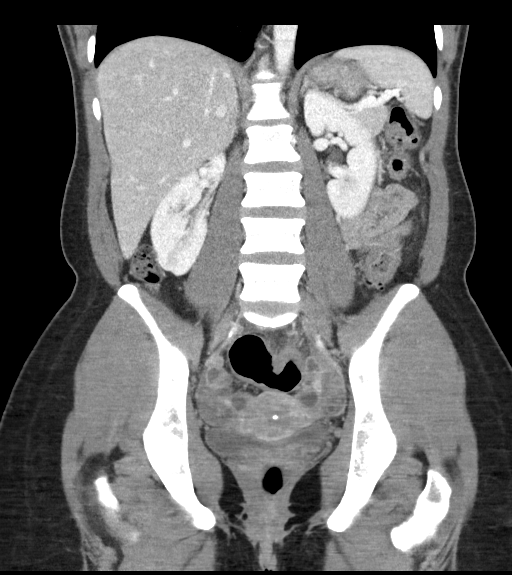
[im 43/78  soft-tissue]
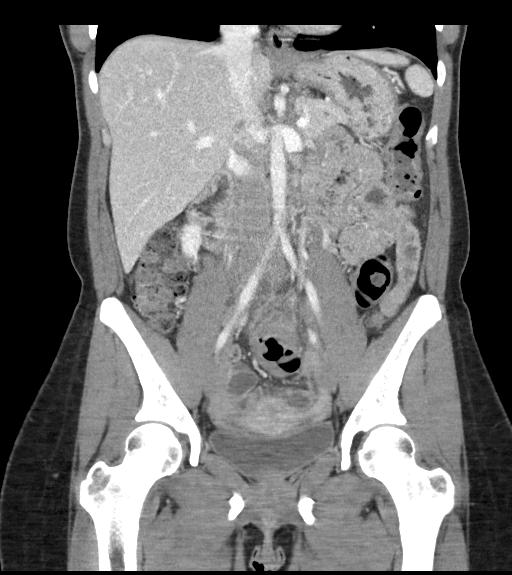

[15 of 46 positions shown; findings below may reference images not displayed]

FINDINGS: Lower chest: Lung bases are clear. Normal heart size. No pericardial
effusion.

Hepatobiliary: Fatty infiltration noted along the falciform
ligament. No concerning focal liver lesion. Smooth liver surface
contour. Normal hepatic enhancement. Normal gallbladder and biliary
tree without visible calcified gallstones.

Pancreas: No pancreatic ductal dilatation or surrounding
inflammatory changes.

Spleen: Normal in size. No concerning splenic lesions.

Adrenals/Urinary Tract: Normal adrenal glands. Kidneys are normally
located with symmetric enhancement small cyst versus scarring in the
upper pole left kidney. No suspicious renal lesion, urolithiasis or
hydronephrosis. Urinary bladder is unremarkable for the degree of
distention.

Stomach/Bowel: Distal esophagus, stomach and duodenal sweep are
unremarkable. No small bowel wall thickening or dilatation. No
evidence of obstruction. Noninflamed appendix in the right lower
quadrant coursing along the pelvic sidewall. There is fairly diffuse
colonic wall thickening albeit with more focal and irregular mural
thickening within the mid sigmoid colon. While several scattered
noninflamed colonic diverticula are present in the mid to distal
colon, a clear culprit diverticulum is not discernibly identified.
No extraluminal gas or organized collection or abscess is seen. No
resulting obstruction is evident.

Vascular/Lymphatic: No significant vascular findings are present. No
enlarged abdominal or pelvic lymph nodes.

Reproductive: Anteverted uterus. Radiodense IUD within expected
location. Normal follicles in the ovaries, better assessed on
comparison pelvic ultrasound.

Other: Inflammatory changes in stranding centered upon the
irregularly thickened sigmoid colon, as above. Some trace layering
free fluid in the deep pelvis may be reactive and/or physiologic. No
free air. No organized abscess or collection.

Musculoskeletal: No acute osseous abnormality or suspicious osseous
lesion.
IMPRESSION: Background of mild, fairly diffuse pancolonic mural thickening could
reflect a colitis however there is a significant more pronounced
focal and irregular region of mural thickening in the proximal to
mid sigmoid colon with extensive surrounding pericolonic
inflammatory change. This does not appear to be centered upon a
culprit diverticulum. Could reflect more focal segmental colitis
though given the irregularity, should warrant further evaluation
with direct visualization as clinically available.

Additional non inflamed colonic diverticula seen elsewhere
throughout the colon.

Geographic focal hepatic fatty infiltration along the falciform
ligament.

These results were called by telephone at the time of interpretation
on 05/02/2021 at [DATE] to provider MERCY MOELLER , who verbally
acknowledged these results.

## 2022-08-25 NOTE — Progress Notes (Addendum)
COVID Vaccine Completed: yes  Date of COVID positive in last 90 days: no  PCP - Clayborne Dana, MD Cardiologist - n/a  Chest x-ray - n/a EKG - n/a Stress Test - n/a ECHO - 08/23/21 [facility] Cardiac Cath - n/a Pacemaker/ICD device last checked: n/a Spinal Cord Stimulator: n/a  Bowel Prep - Clears and Miralax days before  Sleep Study - n/a CPAP -   Fasting Blood Sugar - n/a Checks Blood Sugar _____ times a day  Blood Thinner Instructions: n/a Aspirin Instructions: Last Dose:  Activity level: Can go up a flight of stairs and perform activities of daily living without stopping and without symptoms of chest pain or shortness of breath.    Anesthesia review:   Patient denies shortness of breath, fever, cough and chest pain at PAT appointment  Patient verbalized understanding of instructions that were given to them at the PAT appointment. Patient was also instructed that they will need to review over the PAT instructions again at home before surgery.

## 2022-08-25 NOTE — Patient Instructions (Addendum)
SURGICAL WAITING ROOM VISITATION Patients having surgery or a procedure may have no more than 2 support people in the waiting area - these visitors may rotate.   Children under the age of 62 must have an adult with them who is not the patient. If the patient needs to stay at the hospital during part of their recovery, the visitor guidelines for inpatient rooms apply. Pre-op nurse will coordinate an appropriate time for 1 support person to accompany patient in pre-op.  This support person may not rotate.    Please refer to the [facility] website for the visitor guidelines for Inpatients (after your surgery is over and you are in a regular room).    Your procedure is scheduled on: 09/08/22   Report to [facility] Main Entrance    Report to admitting at 10:15 AM   Call this number if you have problems the morning of surgery [phone]   Follow a clear liquid diet the day before surgery   You may have the following liquids until 9:30 AM DAY OF SURGERY  Water Non-Citrus Juices (without pulp, NO RED) Carbonated Beverages Black Coffee (NO MILK/CREAM OR CREAMERS, sugar ok)  Clear Tea (NO MILK/CREAM OR CREAMERS, sugar ok) regular and decaf                             Plain Jell-O (NO RED)                                           Fruit ices (not with fruit pulp, NO RED)                                     Popsicles (NO RED)                                                               Sports drinks like Gatorade (NO RED)              Drink 2 Ensure drinks AT 10:00 PM the night before surgery.        The day of surgery:  Drink ONE (1) Pre-Surgery Clear Ensure at 9:30 AM the morning of surgery. Drink in one sitting. Do not sip.  This drink was given to you during your hospital  pre-op appointment visit. Nothing else to drink after completing the  Pre-Surgery Clear Ensure.          If you have questions, please contact your surgeon's office.   FOLLOW BOWEL PREP AND ANY  ADDITIONAL PRE OP INSTRUCTIONS YOU RECEIVED FROM YOUR SURGEON'S OFFICE!!!     Oral Hygiene is also important to reduce your risk of infection.                                    Remember - BRUSH YOUR TEETH THE MORNING OF SURGERY WITH YOUR REGULAR TOOTHPASTE   Do NOT smoke after Midnight   Take these medicines the morning of surgery with A  SIP OF WATER: None                               You may not have any metal on your body including hair pins, jewelry, and body piercing             Do not wear make-up, lotions, powders, perfumes, or deodorant  Do not wear nail polish including gel and S&S, artificial/acrylic nails, or any other type of covering on natural nails including finger and toenails. If you have artificial nails, gel coating, etc. that needs to be removed by a nail salon please have this removed prior to surgery or surgery may need to be canceled/ delayed if the surgeon/ anesthesia feels like they are unable to be safely monitored.   Do not shave  48 hours prior to surgery.    Do not bring valuables to the hospital. [place].   Bring small overnight bag day of surgery.   DO NOT [place]. PHARMACY WILL DISPENSE MEDICATIONS LISTED ON YOUR MEDICATION LIST TO YOU DURING YOUR ADMISSION [place]!   Special Instructions: Bring a copy of your healthcare power of attorney and living will documents the day of surgery if you haven't scanned them before.              Please read over the following fact sheets you were given: IF [place] [phone] Schneiders    If you received a COVID test during your pre-op visit  it is requested that you wear a mask when out in public, stay away from anyone that may not be feeling well and notify your surgeon if you develop symptoms. If you test positive for Covid or have been in contact with anyone that has  tested positive in the last 10 days please notify you surgeon.     [place] - Preparing for Surgery Before surgery, you can play an important role.  Because skin is not sterile, your skin needs to be as free of germs as possible.  You can reduce the number of germs on your skin by washing with CHG (chlorahexidine gluconate) soap before surgery.  CHG is an antiseptic cleaner which kills germs and bonds with the skin to continue killing germs even after washing. Please DO NOT use if you have an allergy to CHG or antibacterial soaps.  If your skin becomes reddened/irritated stop using the CHG and inform your nurse when you arrive at Short Stay. Do not shave (including legs and underarms) for at least 48 hours prior to the first CHG shower.  You may shave your face/neck.  Please follow these instructions carefully:  1.  Shower with CHG Soap the night before surgery and the  morning of surgery.  2.  If you choose to wash your hair, wash your hair first as usual with your normal  shampoo.  3.  After you shampoo, rinse your hair and body thoroughly to remove the shampoo.                             4.  Use CHG as you would any other liquid soap.  You can apply chg directly to the skin and wash.  Gently with  a scrungie or clean washcloth.  5.  Apply the CHG Soap to your body ONLY FROM THE NECK DOWN.   Do   not use on face/ open                           Wound or open sores. Avoid contact with eyes, ears mouth and   genitals (private parts).                       Wash face,  Genitals (private parts) with your normal soap.             6.  Wash thoroughly, paying special attention to the area where your    surgery  will be performed.  7.  Thoroughly rinse your body with warm water from the neck down.  8.  DO NOT shower/wash with your normal soap after using and rinsing off the CHG Soap.                9.  Pat yourself dry with a clean towel.            10.  Wear clean pajamas.            11.  Place clean  sheets on your bed the night of your first shower and do not  sleep with pets. Day of Surgery : Do not apply any lotions/deodorants the morning of surgery.  Please wear clean clothes to the hospital/surgery center.  FAILURE TO FOLLOW THESE INSTRUCTIONS MAY RESULT IN THE CANCELLATION OF YOUR SURGERY  PATIENT SIGNATURE_________________________________  NURSE SIGNATURE__________________________________  ________________________________________________________________________   Adam Phenix  An incentive spirometer is a tool that can help keep your lungs clear and active. This tool measures how well you are filling your lungs with each breath. Taking long deep breaths may help reverse or decrease the chance of developing breathing (pulmonary) problems (especially infection) following: A long period of time when you are unable to move or be active. BEFORE THE PROCEDURE  If the spirometer includes an indicator to show your best effort, your nurse or respiratory therapist will set it to a desired goal. If possible, sit up straight or lean slightly forward. Try not to slouch. Hold the incentive spirometer in an upright position. INSTRUCTIONS FOR USE  Sit on the edge of your bed if possible, or sit up as far as you can in bed or on a chair. Hold the incentive spirometer in an upright position. Breathe out normally. Place the mouthpiece in your mouth and seal your lips tightly around it. Breathe in slowly and as deeply as possible, raising the piston or the ball toward the top of the column. Hold your breath for 3-5 seconds or for as long as possible. Allow the piston or ball to fall to the bottom of the column. Remove the mouthpiece from your mouth and breathe out normally. Rest for a few seconds and repeat Steps 1 through 7 at least 10 times every 1-2 hours when you are awake. Take your time and take a few normal breaths between deep breaths. The spirometer may include an indicator to  show your best effort. Use the indicator as a goal to work toward during each repetition. After each set of 10 deep breaths, practice coughing to be sure your lungs are clear. If you have an incision (the cut made at the time of surgery), support your incision when coughing by placing a  pillow or rolled up towels firmly against it. Once you are able to get out of bed, walk around indoors and cough well. You may stop using the incentive spirometer when instructed by your caregiver.  RISKS AND COMPLICATIONS Take your time so you do not get dizzy or light-headed. If you are in pain, you may need to take or ask for pain medication before doing incentive spirometry. It is harder to take a deep breath if you are having pain. AFTER USE Rest and breathe slowly and easily. It can be helpful to keep track of a log of your progress. Your caregiver can provide you with a simple table to help with this. If you are using the spirometer at home, follow these instructions: [place] IF:  You are having difficultly using the spirometer. You have trouble using the spirometer as often as instructed. Your pain medication is not giving enough relief while using the spirometer. You develop fever of 100.5 F (38.1 C) or higher. SEEK IMMEDIATE MEDICAL CARE IF:  You cough up bloody sputum that had not been present before. You develop fever of 102 F (38.9 C) or greater. You develop worsening pain at or near the incision site. MAKE SURE YOU:  Understand these instructions. Will watch your condition. Will get help right away if you are not doing well or get worse. Document Released: 03/06/2007 Document Revised: 01/16/2012 Document Reviewed: 05/07/2007 Exit[employer] Patient Information 2014 [employer], [place].   ________________________________________________________________________  WHAT IS A BLOOD TRANSFUSION? Blood Transfusion Information  A transfusion is the replacement of blood or some of its parts. Blood  is made up of multiple cells which provide different functions. Red blood cells carry oxygen and are used for blood loss replacement. White blood cells fight against infection. Platelets control bleeding. Plasma helps clot blood. Other blood products are available for specialized needs, such as hemophilia or other clotting disorders. BEFORE THE TRANSFUSION  Who gives blood for transfusions?  Healthy volunteers who are fully evaluated to make sure their blood is safe. This is blood bank blood. Transfusion therapy is the safest it has ever been in the practice of medicine. Before blood is taken from a donor, a complete history is taken to make sure that person has no history of diseases nor engages in risky social behavior (examples are intravenous drug use or sexual activity with multiple partners). The donor's travel history is screened to minimize risk of transmitting infections, such as malaria. The donated blood is tested for signs of infectious diseases, such as HIV and hepatitis. The blood is then tested to be sure it is compatible with you in order to minimize the chance of a transfusion reaction. If you or a relative donates blood, this is often done in anticipation of surgery and is not appropriate for emergency situations. It takes many days to process the donated blood. RISKS AND COMPLICATIONS Although transfusion therapy is very safe and saves many lives, the main dangers of transfusion include:  Getting an infectious disease. Developing a transfusion reaction. This is an allergic reaction to something in the blood you were given. Every precaution is taken to prevent this. The decision to have a blood transfusion has been considered carefully by your caregiver before blood is given. Blood is not given unless the benefits outweigh the risks. AFTER THE TRANSFUSION Right after receiving a blood transfusion, you will usually feel much better and more energetic. This is especially true if your  red blood cells have gotten low (anemic). The transfusion  raises the level of the red blood cells which carry oxygen, and this usually causes an energy increase. The nurse administering the transfusion will monitor you carefully for complications. HOME CARE INSTRUCTIONS  No special instructions are needed after a transfusion. You may find your energy is better. Speak with your caregiver about any limitations on activity for underlying diseases you may have. SEEK MEDICAL CARE IF:  Your condition is not improving after your transfusion. You develop redness or irritation at the intravenous (IV) site. SEEK IMMEDIATE MEDICAL CARE IF:  Any of the following symptoms occur over the next 12 hours: Shaking chills. You have a temperature by mouth above 102 F (38.9 C), not controlled by medicine. Chest, back, or muscle pain. People around you feel you are not acting correctly or are confused. Shortness of breath or difficulty breathing. Dizziness and fainting. You get a rash or develop hives. You have a decrease in urine output. Your urine turns a dark color or changes to pink, red, or brown. Any of the following symptoms occur over the next 10 days: You have a temperature by mouth above 102 F (38.9 C), not controlled by medicine. Shortness of breath. Weakness after normal activity. The white part of the eye turns yellow (jaundice). You have a decrease in the amount of urine or are urinating less often. Your urine turns a dark color or changes to pink, red, or brown. Document Released: 10/21/2000 Document Revised: 01/16/2012 Document Reviewed: 06/09/2008 [facility] Patient Information 2014 [place], [place].  _______________________________________________________________________

## 2022-08-29 ENCOUNTER — Encounter (HOSPITAL_COMMUNITY)
Admission: RE | Admit: 2022-08-29 | Discharge: 2022-08-29 | Disposition: A | Discharge status: Home / Self Care | Payer: Commercial Managed Care - HMO | Source: Ambulatory Visit | Attending: General Surgery | Admitting: General Surgery

## 2022-08-29 ENCOUNTER — Encounter (HOSPITAL_COMMUNITY): Payer: Self-pay

## 2022-08-29 VITALS — BP 124/72 | HR 69 | Temp 97.5°F | Resp 14 | Ht 65.0 in | Wt 140.0 lb

## 2022-08-29 DIAGNOSIS — Z01812 Encounter for preprocedural laboratory examination: Secondary | ICD-10-CM | POA: Diagnosis present

## 2022-08-29 DIAGNOSIS — Z01818 Encounter for other preprocedural examination: Secondary | ICD-10-CM

## 2022-08-29 HISTORY — DX: Anxiety disorder, unspecified: F41.9

## 2022-08-29 HISTORY — DX: Depression, unspecified: F32.A

## 2022-08-29 LAB — CBC
HCT: 39.9 % (ref 36.0–46.0)
Hemoglobin: 13.2 g/dL (ref 12.0–15.0)
MCH: 30 pg (ref 26.0–34.0)
MCHC: 33.1 g/dL (ref 30.0–36.0)
MCV: 90.7 fL (ref 80.0–100.0)
Platelets: 414 10*3/uL — ABNORMAL HIGH (ref 150–400)
RBC: 4.4 MIL/uL (ref 3.87–5.11)
RDW: 13.6 % (ref 11.5–15.5)
WBC: 9.5 10*3/uL (ref 4.0–10.5)
nRBC: 0 % (ref 0.0–0.2)

## 2022-09-08 ENCOUNTER — Inpatient Hospital Stay (HOSPITAL_COMMUNITY)
Admission: RE | Admit: 2022-09-08 | Discharge: 2022-09-12 | DRG: 330 | Disposition: A | Discharge status: Home / Self Care | Payer: Commercial Managed Care - HMO | Attending: General Surgery | Admitting: General Surgery

## 2022-09-08 ENCOUNTER — Inpatient Hospital Stay (HOSPITAL_COMMUNITY): Payer: Commercial Managed Care - HMO | Admitting: Anesthesiology

## 2022-09-08 ENCOUNTER — Encounter (HOSPITAL_COMMUNITY): Payer: Self-pay | Admitting: General Surgery

## 2022-09-08 ENCOUNTER — Encounter (HOSPITAL_COMMUNITY)
Admission: RE | Disposition: A | Discharge status: Home / Self Care | Payer: Self-pay | Source: Home / Self Care | Attending: General Surgery

## 2022-09-08 ENCOUNTER — Other Ambulatory Visit: Payer: Self-pay

## 2022-09-08 DIAGNOSIS — Z8249 Family history of ischemic heart disease and other diseases of the circulatory system: Secondary | ICD-10-CM

## 2022-09-08 DIAGNOSIS — K5730 Diverticulosis of large intestine without perforation or abscess without bleeding: Secondary | ICD-10-CM | POA: Diagnosis present

## 2022-09-08 DIAGNOSIS — K5792 Diverticulitis of intestine, part unspecified, without perforation or abscess without bleeding: Secondary | ICD-10-CM

## 2022-09-08 DIAGNOSIS — F17200 Nicotine dependence, unspecified, uncomplicated: Secondary | ICD-10-CM | POA: Diagnosis present

## 2022-09-08 DIAGNOSIS — K5790 Diverticulosis of intestine, part unspecified, without perforation or abscess without bleeding: Principal | ICD-10-CM | POA: Diagnosis present

## 2022-09-08 DIAGNOSIS — N321 Vesicointestinal fistula: Secondary | ICD-10-CM | POA: Diagnosis present

## 2022-09-08 DIAGNOSIS — N739 Female pelvic inflammatory disease, unspecified: Secondary | ICD-10-CM

## 2022-09-08 LAB — TYPE AND SCREEN
ABO/RH(D): B POS
Antibody Screen: NEGATIVE

## 2022-09-08 LAB — BASIC METABOLIC PANEL
Anion gap: 9 (ref 5–15)
BUN: 9 mg/dL (ref 6–20)
CO2: 20 mmol/L — ABNORMAL LOW (ref 22–32)
Calcium: 8.9 mg/dL (ref 8.9–10.3)
Chloride: 108 mmol/L (ref 98–111)
Creatinine, Ser: 0.9 mg/dL (ref 0.44–1.00)
GFR, Estimated: >60 mL/min (ref >60)
Glucose, Bld: 149 mg/dL — ABNORMAL HIGH (ref 70–99)
Potassium: 3.3 mmol/L — ABNORMAL LOW (ref 3.5–5.1)
Sodium: 137 mmol/L (ref 135–145)

## 2022-09-08 LAB — ABO/RH: ABO/RH(D): B POS

## 2022-09-08 LAB — POCT PREGNANCY, URINE: Preg Test, Ur: NEGATIVE

## 2022-09-08 SURGERY — COLECTOMY, PARTIAL, ROBOT-ASSISTED, LAPAROSCOPIC
Anesthesia: General

## 2022-09-08 MED ORDER — ENSURE PRE-SURGERY PO LIQD: 592.0000 mL | Freq: Once | ORAL | Status: DC

## 2022-09-08 MED ORDER — ONDANSETRON HCL 4 MG PO TABS
4.0000 mg | ORAL_TABLET | Freq: Four times a day (QID) | ORAL | Status: DC | PRN
  Administered 2022-09-08: 4 mg via ORAL
  Filled 2022-09-08: qty 1

## 2022-09-08 MED ORDER — BUPIVACAINE-EPINEPHRINE 0.5% -1:200000 IJ SOLN
INTRAMUSCULAR | Status: DC | PRN
  Administered 2022-09-08: 30 mL

## 2022-09-08 MED ORDER — PROPOFOL 10 MG/ML IV BOLUS
INTRAVENOUS | Status: DC | PRN
  Administered 2022-09-08: 140 mg via INTRAVENOUS
  Administered 2022-09-08: 60 mg via INTRAVENOUS

## 2022-09-08 MED ORDER — LIDOCAINE HCL (PF) 2 % IJ SOLN
INTRAMUSCULAR | Status: DC | PRN
  Administered 2022-09-08: 1.5 mg/kg/h via INTRADERMAL

## 2022-09-08 MED ORDER — PROPOFOL 500 MG/50ML IV EMUL
INTRAVENOUS | Status: DC | PRN
  Administered 2022-09-08: 25 ug/kg/min via INTRAVENOUS

## 2022-09-08 MED ORDER — POLYVINYL ALCOHOL 1.4 % OP SOLN
1.0000 [drp] | OPHTHALMIC | Status: DC | PRN
  Administered 2022-09-08: 1 [drp] via OPHTHALMIC
  Filled 2022-09-08: qty 15

## 2022-09-08 MED ORDER — SODIUM CHLORIDE 0.9 % IV SOLN
2.0000 g | INTRAVENOUS | Status: AC
  Administered 2022-09-08: 2 g via INTRAVENOUS
  Filled 2022-09-08: qty 2

## 2022-09-08 MED ORDER — ENSURE PRE-SURGERY PO LIQD: 296.0000 mL | Freq: Once | ORAL | Status: DC

## 2022-09-08 MED ORDER — SODIUM CHLORIDE 0.9 % IR SOLN
Status: DC | PRN
  Administered 2022-09-08: 1000 mL via INTRAVESICAL

## 2022-09-08 MED ORDER — ONDANSETRON HCL 4 MG/2ML IJ SOLN
INTRAMUSCULAR | Status: AC
  Filled 2022-09-08: qty 2

## 2022-09-08 MED ORDER — CHLORHEXIDINE GLUCONATE 0.12 % MT SOLN
15.0000 mL | Freq: Once | OROMUCOSAL | Status: AC
  Administered 2022-09-08: 15 mL via OROMUCOSAL

## 2022-09-08 MED ORDER — KETOROLAC TROMETHAMINE 30 MG/ML IJ SOLN
INTRAMUSCULAR | Status: AC
  Administered 2022-09-08: 30 mg via INTRAVENOUS
  Filled 2022-09-08: qty 1

## 2022-09-08 MED ORDER — PHENYLEPHRINE 80 MCG/ML (10ML) SYRINGE FOR IV PUSH (FOR BLOOD PRESSURE SUPPORT)
PREFILLED_SYRINGE | INTRAVENOUS | Status: DC | PRN
  Administered 2022-09-08: 120 ug via INTRAVENOUS
  Administered 2022-09-08 (×2): 160 ug via INTRAVENOUS
  Administered 2022-09-08: 120 ug via INTRAVENOUS

## 2022-09-08 MED ORDER — SUGAMMADEX SODIUM 200 MG/2ML IV SOLN
INTRAVENOUS | Status: DC | PRN
  Administered 2022-09-08: 200 mg via INTRAVENOUS

## 2022-09-08 MED ORDER — PHENYLEPHRINE 80 MCG/ML (10ML) SYRINGE FOR IV PUSH (FOR BLOOD PRESSURE SUPPORT)
PREFILLED_SYRINGE | INTRAVENOUS | Status: AC
  Filled 2022-09-08: qty 10

## 2022-09-08 MED ORDER — DEXAMETHASONE SODIUM PHOSPHATE 4 MG/ML IJ SOLN
INTRAMUSCULAR | Status: DC | PRN
  Administered 2022-09-08: 10 mg via INTRAVENOUS

## 2022-09-08 MED ORDER — GABAPENTIN 300 MG PO CAPS
300.0000 mg | ORAL_CAPSULE | ORAL | Status: AC
  Administered 2022-09-08: 300 mg via ORAL
  Filled 2022-09-08: qty 1

## 2022-09-08 MED ORDER — LACTATED RINGERS IV SOLN: INTRAVENOUS | Status: DC

## 2022-09-08 MED ORDER — PROMETHAZINE HCL 25 MG/ML IJ SOLN
6.2500 mg | INTRAMUSCULAR | Status: DC | PRN
  Administered 2022-09-08: 12.5 mg via INTRAVENOUS

## 2022-09-08 MED ORDER — ALUM & MAG HYDROXIDE-SIMETH 200-200-20 MG/5ML PO SUSP
30.0000 mL | Freq: Four times a day (QID) | ORAL | Status: DC | PRN
  Administered 2022-09-10: 30 mL via ORAL
  Filled 2022-09-08: qty 30

## 2022-09-08 MED ORDER — HYDROMORPHONE HCL 1 MG/ML IJ SOLN
0.5000 mg | INTRAMUSCULAR | Status: DC | PRN
  Administered 2022-09-08 – 2022-09-11 (×11): 0.5 mg via INTRAVENOUS
  Filled 2022-09-08 (×11): qty 0.5

## 2022-09-08 MED ORDER — DEXAMETHASONE SODIUM PHOSPHATE 10 MG/ML IJ SOLN
INTRAMUSCULAR | Status: AC
  Filled 2022-09-08: qty 1

## 2022-09-08 MED ORDER — ROCURONIUM BROMIDE 10 MG/ML (PF) SYRINGE
PREFILLED_SYRINGE | INTRAVENOUS | Status: DC | PRN
  Administered 2022-09-08: 100 mg via INTRAVENOUS

## 2022-09-08 MED ORDER — POLYETHYLENE GLYCOL 3350 17 GM/SCOOP PO POWD: 1.0000 | Freq: Once | ORAL | Status: DC

## 2022-09-08 MED ORDER — STERILE WATER FOR INJECTION IJ SOLN
INTRAMUSCULAR | Status: DC | PRN
  Administered 2022-09-08: 15 mL via INTRAVESICAL

## 2022-09-08 MED ORDER — LACTATED RINGERS IR SOLN
Status: DC | PRN
  Administered 2022-09-08: 1000 mL

## 2022-09-08 MED ORDER — AMISULPRIDE (ANTIEMETIC) 5 MG/2ML IV SOLN: 10.0000 mg | Freq: Once | INTRAVENOUS | Status: DC | PRN

## 2022-09-08 MED ORDER — OXYCODONE HCL 5 MG PO TABS: 5.0000 mg | ORAL_TABLET | Freq: Once | ORAL | Status: DC | PRN

## 2022-09-08 MED ORDER — FENTANYL CITRATE (PF) 250 MCG/5ML IJ SOLN
INTRAMUSCULAR | Status: AC
  Filled 2022-09-08: qty 5

## 2022-09-08 MED ORDER — ALVIMOPAN 12 MG PO CAPS
12.0000 mg | ORAL_CAPSULE | ORAL | Status: AC
  Administered 2022-09-08: 12 mg via ORAL
  Filled 2022-09-08: qty 1

## 2022-09-08 MED ORDER — OXYCODONE HCL 5 MG/5ML PO SOLN: 5.0000 mg | Freq: Once | ORAL | Status: DC | PRN

## 2022-09-08 MED ORDER — ONDANSETRON HCL 4 MG/2ML IJ SOLN
INTRAMUSCULAR | Status: DC | PRN
  Administered 2022-09-08: 4 mg via INTRAVENOUS

## 2022-09-08 MED ORDER — KETAMINE HCL 10 MG/ML IJ SOLN
INTRAMUSCULAR | Status: DC | PRN
  Administered 2022-09-08: 30 mg via INTRAVENOUS

## 2022-09-08 MED ORDER — ENSURE SURGERY PO LIQD
237.0000 mL | Freq: Two times a day (BID) | ORAL | Status: DC
  Administered 2022-09-11: 237 mL via ORAL

## 2022-09-08 MED ORDER — DIPHENHYDRAMINE HCL 25 MG PO CAPS: 25.0000 mg | ORAL_CAPSULE | Freq: Four times a day (QID) | ORAL | Status: DC | PRN

## 2022-09-08 MED ORDER — ORAL CARE MOUTH RINSE: 15.0000 mL | Freq: Once | OROMUCOSAL | Status: AC

## 2022-09-08 MED ORDER — SODIUM CHLORIDE 0.9 % IV SOLN
2.0000 g | Freq: Two times a day (BID) | INTRAVENOUS | Status: AC
  Administered 2022-09-08: 2 g via INTRAVENOUS
  Filled 2022-09-08: qty 2

## 2022-09-08 MED ORDER — ACETAMINOPHEN 500 MG PO TABS
1000.0000 mg | ORAL_TABLET | Freq: Four times a day (QID) | ORAL | Status: DC
  Administered 2022-09-08 – 2022-09-12 (×11): 1000 mg via ORAL
  Filled 2022-09-08 (×14): qty 2

## 2022-09-08 MED ORDER — MIDAZOLAM HCL 2 MG/2ML IJ SOLN
INTRAMUSCULAR | Status: AC
  Filled 2022-09-08: qty 2

## 2022-09-08 MED ORDER — ENOXAPARIN SODIUM 40 MG/0.4ML IJ SOSY
40.0000 mg | PREFILLED_SYRINGE | INTRAMUSCULAR | Status: DC
  Administered 2022-09-09 – 2022-09-10 (×2): 40 mg via SUBCUTANEOUS
  Filled 2022-09-08 (×4): qty 0.4

## 2022-09-08 MED ORDER — TRAMADOL HCL 50 MG PO TABS
50.0000 mg | ORAL_TABLET | Freq: Four times a day (QID) | ORAL | Status: DC | PRN
  Administered 2022-09-08 – 2022-09-09 (×2): 50 mg via ORAL
  Filled 2022-09-08 (×2): qty 1

## 2022-09-08 MED ORDER — FENTANYL CITRATE PF 50 MCG/ML IJ SOSY
25.0000 ug | PREFILLED_SYRINGE | INTRAMUSCULAR | Status: DC | PRN
  Administered 2022-09-08 (×3): 25 ug via INTRAVENOUS

## 2022-09-08 MED ORDER — ONDANSETRON HCL 4 MG/2ML IJ SOLN
4.0000 mg | Freq: Four times a day (QID) | INTRAMUSCULAR | Status: DC | PRN
  Administered 2022-09-09: 4 mg via INTRAVENOUS
  Filled 2022-09-08: qty 2

## 2022-09-08 MED ORDER — PROMETHAZINE HCL 25 MG/ML IJ SOLN
INTRAMUSCULAR | Status: AC
  Filled 2022-09-08: qty 1

## 2022-09-08 MED ORDER — ROCURONIUM BROMIDE 10 MG/ML (PF) SYRINGE
PREFILLED_SYRINGE | INTRAVENOUS | Status: AC
  Filled 2022-09-08: qty 10

## 2022-09-08 MED ORDER — BUPIVACAINE LIPOSOME 1.3 % IJ SUSP
20.0000 mL | Freq: Once | INTRAMUSCULAR | Status: AC
  Administered 2022-09-08: 20 mL

## 2022-09-08 MED ORDER — SCOPOLAMINE 1 MG/3DAYS TD PT72
1.0000 | MEDICATED_PATCH | TRANSDERMAL | Status: DC
  Administered 2022-09-08: 1.5 mg via TRANSDERMAL
  Filled 2022-09-08: qty 1

## 2022-09-08 MED ORDER — KCL IN DEXTROSE-NACL 20-5-0.45 MEQ/L-%-% IV SOLN
INTRAVENOUS | Status: DC
  Filled 2022-09-08 (×5): qty 1000

## 2022-09-08 MED ORDER — SACCHAROMYCES BOULARDII 250 MG PO CAPS
250.0000 mg | ORAL_CAPSULE | Freq: Two times a day (BID) | ORAL | Status: DC
  Administered 2022-09-08 – 2022-09-11 (×7): 250 mg via ORAL
  Filled 2022-09-08 (×7): qty 1

## 2022-09-08 MED ORDER — ACYCLOVIR 400 MG PO TABS
800.0000 mg | ORAL_TABLET | Freq: Every day | ORAL | Status: DC
  Administered 2022-09-08 – 2022-09-11 (×4): 800 mg via ORAL
  Filled 2022-09-08 (×4): qty 2

## 2022-09-08 MED ORDER — KETOROLAC TROMETHAMINE 30 MG/ML IJ SOLN: 30.0000 mg | Freq: Once | INTRAMUSCULAR | Status: AC | PRN

## 2022-09-08 MED ORDER — SPY AGENT GREEN - (INDOCYANINE FOR INJECTION)
INTRAMUSCULAR | Status: DC | PRN
  Administered 2022-09-08: 4 mL via INTRAVENOUS

## 2022-09-08 MED ORDER — ACETAMINOPHEN 500 MG PO TABS
1000.0000 mg | ORAL_TABLET | ORAL | Status: AC
  Administered 2022-09-08: 1000 mg via ORAL
  Filled 2022-09-08: qty 2

## 2022-09-08 MED ORDER — LIDOCAINE 2% (20 MG/ML) 5 ML SYRINGE
INTRAMUSCULAR | Status: DC | PRN
  Administered 2022-09-08: 80 mg via INTRAVENOUS

## 2022-09-08 MED ORDER — LIDOCAINE HCL 2 % IJ SOLN
INTRAMUSCULAR | Status: AC
  Filled 2022-09-08: qty 20

## 2022-09-08 MED ORDER — AMPHETAMINE-DEXTROAMPHET ER 10 MG PO CP24
30.0000 mg | ORAL_CAPSULE | Freq: Every day | ORAL | Status: DC
  Filled 2022-09-08: qty 3

## 2022-09-08 MED ORDER — FENTANYL CITRATE PF 50 MCG/ML IJ SOSY
PREFILLED_SYRINGE | INTRAMUSCULAR | Status: AC
  Administered 2022-09-08: 25 ug via INTRAVENOUS
  Filled 2022-09-08: qty 2

## 2022-09-08 MED ORDER — BISACODYL 5 MG PO TBEC: 20.0000 mg | DELAYED_RELEASE_TABLET | Freq: Once | ORAL | Status: DC

## 2022-09-08 MED ORDER — FENTANYL CITRATE (PF) 100 MCG/2ML IJ SOLN
INTRAMUSCULAR | Status: DC | PRN
  Administered 2022-09-08: 100 ug via INTRAVENOUS
  Administered 2022-09-08 (×3): 50 ug via INTRAVENOUS

## 2022-09-08 MED ORDER — LACTATED RINGERS IV SOLN: INTRAVENOUS | Status: DC | PRN

## 2022-09-08 MED ORDER — ALVIMOPAN 12 MG PO CAPS
12.0000 mg | ORAL_CAPSULE | Freq: Two times a day (BID) | ORAL | Status: DC
  Administered 2022-09-09 – 2022-09-10 (×3): 12 mg via ORAL
  Filled 2022-09-08 (×4): qty 1

## 2022-09-08 MED ORDER — MIDAZOLAM HCL 5 MG/5ML IJ SOLN
INTRAMUSCULAR | Status: DC | PRN
  Administered 2022-09-08: 2 mg via INTRAVENOUS

## 2022-09-08 MED ORDER — GABAPENTIN 300 MG PO CAPS
300.0000 mg | ORAL_CAPSULE | Freq: Two times a day (BID) | ORAL | Status: DC
  Administered 2022-09-08 – 2022-09-11 (×7): 300 mg via ORAL
  Filled 2022-09-08 (×7): qty 1

## 2022-09-08 MED ORDER — PROPOFOL 1000 MG/100ML IV EMUL
INTRAVENOUS | Status: AC
  Filled 2022-09-08: qty 100

## 2022-09-08 MED ORDER — DIPHENHYDRAMINE HCL 50 MG/ML IJ SOLN: 25.0000 mg | Freq: Four times a day (QID) | INTRAMUSCULAR | Status: DC | PRN

## 2022-09-08 MED ORDER — 0.9 % SODIUM CHLORIDE (POUR BTL) OPTIME
TOPICAL | Status: DC | PRN
  Administered 2022-09-08: 3000 mL

## 2022-09-08 SURGICAL SUPPLY — 106 items
ADAPTER GOLDBERG URETERAL (ADAPTER) IMPLANT
BAG COUNTER SPONGE SURGICOUNT (BAG) ×1 IMPLANT
BAG URO CATCHER STRL LF (MISCELLANEOUS) ×1 IMPLANT
BLADE EXTENDED COATED 6.5IN (ELECTRODE) IMPLANT
CANNULA REDUC XI 12-8 STAPL (CANNULA)
CANNULA REDUCER 12-8 DVNC XI (CANNULA) IMPLANT
CATH URETL OPEN 5X70 (CATHETERS) IMPLANT
CELLS DAT CNTRL 66122 CELL SVR (MISCELLANEOUS) IMPLANT
CLOTH BEACON ORANGE TIMEOUT ST (SAFETY) ×1 IMPLANT
COVER SURGICAL LIGHT HANDLE (MISCELLANEOUS) ×2 IMPLANT
COVER TIP SHEARS 8 DVNC (MISCELLANEOUS) ×1 IMPLANT
COVER TIP SHEARS 8MM DA VINCI (MISCELLANEOUS) ×1
DERMABOND ADVANCED .7 DNX12 (GAUZE/BANDAGES/DRESSINGS) IMPLANT
DRAIN CHANNEL 19F RND (DRAIN) IMPLANT
DRAPE ARM DVNC X/XI (DISPOSABLE) ×4 IMPLANT
DRAPE COLUMN DVNC XI (DISPOSABLE) ×1 IMPLANT
DRAPE DA VINCI XI ARM (DISPOSABLE) ×4
DRAPE DA VINCI XI COLUMN (DISPOSABLE) ×1
DRAPE SURG IRRIG POUCH 19X23 (DRAPES) ×1 IMPLANT
DRSG OPSITE POSTOP 4X10 (GAUZE/BANDAGES/DRESSINGS) IMPLANT
DRSG OPSITE POSTOP 4X6 (GAUZE/BANDAGES/DRESSINGS) IMPLANT
DRSG OPSITE POSTOP 4X8 (GAUZE/BANDAGES/DRESSINGS) IMPLANT
ELECT PENCIL ROCKER SW 15FT (MISCELLANEOUS) ×1 IMPLANT
ELECT REM PT RETURN 15FT ADLT (MISCELLANEOUS) ×1 IMPLANT
ENDOLOOP SUT PDS II  0 18 (SUTURE)
ENDOLOOP SUT PDS II 0 18 (SUTURE) IMPLANT
EVACUATOR SILICONE 100CC (DRAIN) IMPLANT
GLOVE BIO SURGEON STRL SZ 6.5 (GLOVE) ×3 IMPLANT
GLOVE BIOGEL PI IND STRL 7.0 (GLOVE) ×2 IMPLANT
GLOVE INDICATOR 6.5 STRL GRN (GLOVE) ×1 IMPLANT
GLOVE SURG LX STRL 7.5 STRW (GLOVE) ×1 IMPLANT
GOWN SRG XL LVL 4 BRTHBL STRL (GOWNS) ×1 IMPLANT
GOWN STRL NON-REIN XL LVL4 (GOWNS) ×1
GOWN STRL REUS W/ TWL LRG LVL3 (GOWN DISPOSABLE) ×2 IMPLANT
GOWN STRL REUS W/ TWL XL LVL3 (GOWN DISPOSABLE) ×3 IMPLANT
GOWN STRL REUS W/TWL LRG LVL3 (GOWN DISPOSABLE) ×2
GOWN STRL REUS W/TWL XL LVL3 (GOWN DISPOSABLE) ×3
GRASPER SUT TROCAR 14GX15 (MISCELLANEOUS) IMPLANT
GUIDEWIRE ANG ZIPWIRE 038X150 (WIRE) IMPLANT
GUIDEWIRE STR DUAL SENSOR (WIRE) IMPLANT
HOLDER FOLEY CATH W/STRAP (MISCELLANEOUS) ×1 IMPLANT
IRRIG SUCT STRYKERFLOW 2 WTIP (MISCELLANEOUS) ×1
IRRIGATION SUCT STRKRFLW 2 WTP (MISCELLANEOUS) ×1 IMPLANT
KIT PROCEDURE DA VINCI SI (MISCELLANEOUS) ×1
KIT PROCEDURE DVNC SI (MISCELLANEOUS) ×1 IMPLANT
KIT TURNOVER KIT A (KITS) IMPLANT
MANIFOLD NEPTUNE II (INSTRUMENTS) ×1 IMPLANT
NDL INSUFFLATION 14GA 120MM (NEEDLE) ×1 IMPLANT
NEEDLE INSUFFLATION 14GA 120MM (NEEDLE) ×1 IMPLANT
PACK CARDIOVASCULAR III (CUSTOM PROCEDURE TRAY) ×1 IMPLANT
PACK COLON (CUSTOM PROCEDURE TRAY) ×1 IMPLANT
PACK CYSTO (CUSTOM PROCEDURE TRAY) ×1 IMPLANT
PAD POSITIONING PINK XL (MISCELLANEOUS) ×1 IMPLANT
RELOAD STAPLE 60 3.5 BLU DVNC (STAPLE) IMPLANT
RELOAD STAPLE 60 4.3 GRN DVNC (STAPLE) IMPLANT
RELOAD STAPLER 3.5X60 BLU DVNC (STAPLE) ×2 IMPLANT
RELOAD STAPLER 4.3X60 GRN DVNC (STAPLE) IMPLANT
RETRACTOR WND ALEXIS 18 MED (MISCELLANEOUS) IMPLANT
RTRCTR WOUND ALEXIS 18CM MED (MISCELLANEOUS)
SCISSORS LAP 5X35 DISP (ENDOMECHANICALS) IMPLANT
SEAL CANN UNIV 5-8 DVNC XI (MISCELLANEOUS) ×3 IMPLANT
SEAL XI 5MM-8MM UNIVERSAL (MISCELLANEOUS) ×3
SEALER VESSEL DA VINCI XI (MISCELLANEOUS) ×1
SEALER VESSEL EXT DVNC XI (MISCELLANEOUS) ×1 IMPLANT
SOLUTION ELECTROLUBE (MISCELLANEOUS) ×1 IMPLANT
SPIKE FLUID TRANSFER (MISCELLANEOUS) IMPLANT
STAPLER 60 DA VINCI SURE FORM (STAPLE) ×1
STAPLER 60 SUREFORM DVNC (STAPLE) IMPLANT
STAPLER CANNULA SEAL DVNC XI (STAPLE) IMPLANT
STAPLER CANNULA SEAL XI (STAPLE)
STAPLER ECHELON POWER CIR 29 (STAPLE) IMPLANT
STAPLER ECHELON POWER CIR 31 (STAPLE) IMPLANT
STAPLER RELOAD 3.5X60 BLU DVNC (STAPLE) ×2
STAPLER RELOAD 3.5X60 BLUE (STAPLE) ×2
STAPLER RELOAD 4.3X60 GREEN (STAPLE)
STAPLER RELOAD 4.3X60 GRN DVNC (STAPLE)
STOPCOCK 4 WAY LG BORE MALE ST (IV SETS) ×2 IMPLANT
SUT ETHILON 2 0 PS N (SUTURE) IMPLANT
SUT NOVA NAB DX-16 0-1 5-0 T12 (SUTURE) ×2 IMPLANT
SUT PROLENE 2 0 KS (SUTURE) IMPLANT
SUT SILK 2 0 (SUTURE) ×1
SUT SILK 2 0 SH CR/8 (SUTURE) IMPLANT
SUT SILK 2-0 18XBRD TIE 12 (SUTURE) ×1 IMPLANT
SUT SILK 3 0 (SUTURE)
SUT SILK 3 0 SH CR/8 (SUTURE) ×1 IMPLANT
SUT SILK 3-0 18XBRD TIE 12 (SUTURE) IMPLANT
SUT V-LOC BARB 180 2/0GR6 GS22 (SUTURE)
SUT VIC AB 2-0 SH 18 (SUTURE) IMPLANT
SUT VIC AB 2-0 SH 27 (SUTURE)
SUT VIC AB 2-0 SH 27X BRD (SUTURE) IMPLANT
SUT VIC AB 3-0 SH 18 (SUTURE) IMPLANT
SUT VIC AB 4-0 PS2 27 (SUTURE) ×2 IMPLANT
SUT VICRYL 0 UR6 27IN ABS (SUTURE) ×1 IMPLANT
SUTURE V-LC BRB 180 2/0GR6GS22 (SUTURE) IMPLANT
SYR 10ML ECCENTRIC (SYRINGE) ×1 IMPLANT
SYS LAPSCP GELPORT 120MM (MISCELLANEOUS)
SYS WOUND ALEXIS 18CM MED (MISCELLANEOUS)
SYSTEM LAPSCP GELPORT 120MM (MISCELLANEOUS) IMPLANT
SYSTEM WOUND ALEXIS 18CM MED (MISCELLANEOUS) IMPLANT
TOWEL OR 17X26 10 PK STRL BLUE (TOWEL DISPOSABLE) IMPLANT
TOWEL OR NON WOVEN STRL DISP B (DISPOSABLE) ×1 IMPLANT
TRAY FOLEY MTR SLVR 16FR STAT (SET/KITS/TRAYS/PACK) ×1 IMPLANT
TROCAR ADV FIXATION 5X100MM (TROCAR) ×1 IMPLANT
TUBING CONNECTING 10 (TUBING) ×3 IMPLANT
TUBING INSUFFLATION 10FT LAP (TUBING) ×1 IMPLANT
TUBING UROLOGY SET (TUBING) IMPLANT

## 2022-09-08 NOTE — Op Note (Signed)
Preoperative diagnosis:  Pelvic Abscess   Diverticulitis Postoperative diagnosis:  Same   Procedure: Cystoscopy Instillation of ureteral firefly constrast   Surgeon: Ardis Hughs, MD   Anesthesia: General   Complications: None   Intraoperative findings: tight ureter 10cm from left ureteral orifice.   EBL: Minimal   Specimens: None   Indication:  Sylvia Garcia  is a 36 y.o.  patient with diverticular abscess.  Dr. Marcello Moores requested cystoscopy and instillation of firefly contrast to help facilitate the dissection of the sigmoid colon.  After reviewing the management options for treatment, he elected to proceed with the above surgical procedure(s). We have discussed the potential benefits and risks of the procedure, side effects of the proposed treatment, the likelihood of the patient achieving the goals of the procedure, and any potential problems that might occur during the procedure or recuperation. Informed consent has been obtained.   Description of procedure:   The patient was taken to the operating room and general anesthesia was induced.  The patient was placed in the dorsal lithotomy position, prepped and draped in the usual sterile fashion, and preoperative antibiotics were administered. A preoperative time-out was performed.    A 21 French 30 degree cystoscope was gently passed through the patient's urethra into the bladder.  The bladder was subsequently emptied and then filled slowly up performing a 360 degrees cystoscopic evaluation.  This demonstrated orthotopic ureteral orifices, normal bladder mucosa with an area of heaped mucosa and bullous edema in the dome -  evidence of colovesical fistula without mucosal abnormality.   I then advanced a 5 [place] open-ended ureteral catheter into the patient's left ureteral orifice and  I then advanced the catheter up into the proximal ureter and then slowly pulled back and injected 7.83ml of the firefly contrast.  Subsequently  turned my attention to the patient's right ureteral orifice and performed a similar task.   I then  placed a 16 [place] Foley.    The surgery was then turned over to Dr. Marcello Moores for facilitation of the remainder of the case.

## 2022-09-08 NOTE — Anesthesia Preprocedure Evaluation (Addendum)
Anesthesia Evaluation  Patient identified by MRN, date of birth, ID band Patient awake    Reviewed: Allergy & Precautions, NPO status , Patient's Chart, lab work & pertinent test results  Airway Mallampati: I  TM Distance: >3 FB Neck ROM: Full    Dental no notable dental hx.    Pulmonary former smoker   Pulmonary exam normal        Cardiovascular negative cardio ROS Normal cardiovascular exam     Neuro/Psych  PSYCHIATRIC DISORDERS Anxiety Depression    negative neurological ROS     GI/Hepatic negative GI ROS, Neg liver ROS,,,  Endo/Other  negative endocrine ROS    Renal/GU negative Renal ROS     Musculoskeletal negative musculoskeletal ROS (+)    Abdominal   Peds  Hematology negative hematology ROS (+)   Anesthesia Other Findings DIVERTICULAR DISEASE  Reproductive/Obstetrics Hcg negative                             Anesthesia Physical Anesthesia Plan  ASA: 1  Anesthesia Plan: General   Post-op Pain Management:    Induction: Intravenous  PONV Risk Score and Plan: 4 or greater and Ondansetron, Dexamethasone, Midazolam, Scopolamine patch - Pre-op and Treatment may vary due to age or medical condition  Airway Management Planned: Oral ETT  Additional Equipment:   Intra-op Plan:   Post-operative Plan: Extubation in OR  Informed Consent: I have reviewed the patients History and Physical, chart, labs and discussed the procedure including the risks, benefits and alternatives for the proposed anesthesia with the patient or authorized representative who has indicated his/her understanding and acceptance.     Dental advisory given  Plan Discussed with: CRNA  Anesthesia Plan Comments:        Anesthesia Quick Evaluation

## 2022-09-08 NOTE — Op Note (Signed)
09/08/2022  1:35 PM  PATIENT:  Sylvia Garcia  36 y.o. female  Patient Care Team: Caffie Damme, MD as PCP - General (Family Medicine)  PRE-OPERATIVE DIAGNOSIS:  DIVERTICULAR DISEASE  POST-OPERATIVE DIAGNOSIS:  DIVERTICULAR STRICTURE  PROCEDURE:  XI ROBOT ASSISTED SIGMOIDECTOMY CYSTOSCOPY with FIREFLY INJECTION    Surgeon(s): Romie Levee, MD Crist Fat, MD Karie Soda, MD  ASSISTANT: Dr Michaell Cowing   ANESTHESIA:   local and general  EBL: Total I/O In: 1300 [I.V.:1200; IV Piggyback:100] Out: 200 [Urine:100; Blood:100]  Delay start of Pharmacological VTE agent (>24hrs) due to surgical blood loss or risk of bleeding:  no  DRAINS: none   SPECIMEN:  Source of Specimen:  Sigmoid colon  DISPOSITION OF SPECIMEN:  PATHOLOGY  COUNTS:  YES  PLAN OF CARE: Admit to inpatient   PATIENT DISPOSITION:  PACU - hemodynamically stable.  INDICATION:    36 y.o. F with recurring diverticulitis.  I recommended segmental resection:  The anatomy & physiology of the digestive tract was discussed.  The pathophysiology was discussed.  Natural history risks without surgery was discussed.   I worked to give an overview of the disease and the frequent need to have multispecialty involvement.  I feel the risks of no intervention will lead to serious problems that outweigh the operative risks; therefore, I recommended a partial colectomy to remove the pathology.  Laparoscopic & open techniques were discussed.   Risks such as bleeding, infection, abscess, leak, reoperation, possible ostomy, hernia, heart attack, death, and other risks were discussed.  I noted a good likelihood this will help address the problem.   Goals of post-operative recovery were discussed as well.    The patient expressed understanding & wished to proceed with surgery.  OR FINDINGS:   Patient had significantly thickened Sigmoid colon with minimal external adhesions.    No obvious metastatic disease on  visceral parietal peritoneum or liver.  The anastomosis rests 13 cm from the anal verge by rigid proctoscopy.  DESCRIPTION:   Informed consent was confirmed.  The patient underwent general anaesthesia without difficulty.  The patient was positioned appropriately.  VTE prevention in place.  The patient's abdomen was clipped, prepped, & draped in a sterile fashion.  Surgical timeout confirmed our plan.  The patient was positioned in reverse Trendelenburg.  Abdominal entry was gained using a Varies needle in the LUQ.  Entry was clean.  I induced carbon dioxide insufflation.  An 87mm robotic port was placed in the RUQ.  Camera inspection revealed no injury.  Extra ports were carefully placed under direct laparoscopic visualization.  I laparoscopically reflected the greater omentum and the upper abdomen the small bowel in the upper abdomen. The patient was appropriately positioned and the robot was docked to the patient's left side.  Instruments were placed under direct visualization.    I mobilized the sigmoid colon off of the pelvic sidewall.  I scored the base of peritoneum of the right side of the mesentery of the left colon from the ligament of Treitz to the peritoneal reflection of the mid rectum.  The patient had some posterior adhesions to the left retroperitoneum.  I elevated the sigmoid mesentery and enetered into the retro-mesenteric plane. We were able to identify the left ureter and gonadal vessels. We kept those posterior within the retroperitoneum and elevated the left colon mesentery off that. I did isolated IMA pedicle but did not ligate it yet.  I continued distally and got into the avascular plane posterior to the mesorectum. This  allowed me to help mobilize the rectum as well by freeing the mesorectum off the sacrum.  I mobilized the peritoneal coverings towards the peritoneal reflection on both the right and left sides of the rectum.  I could see the right and left ureters and stayed away  from them.    I skeletonized the inferior mesenteric artery pedicle.  I went down to its takeoff from the aorta.  After confirming the left ureter was out of the way, I went ahead and ligated the inferior mesenteric artery pedicle with bipolar robotic vessel sealer well above its takeoff from the aorta.  We ensured hemostasis. I skeletonized the mesorectum at the junction at the proximal rectum using blunt dissection & bipolar robotic vessel sealer.  I mobilized the left colon in a lateral to medial fashion off the line of Toldt up towards the splenic flexure to ensure good mobilization of the left colon to reach into the pelvis.  I divided the rectosigmoid with 2 14mm blue load robotic staplers.  I divided the mesentery with the vessel sealer to the level of the descending sigmoid colon junction.  The robot was then undocked and the suprapubic port was enlarged.  A wound protector was placed.  The colon was brought out and transected over a pursestring device.  A 55mm EEA anvil was placed and the pursestring was tied tightly around it.  This was placed in the abdomen and the EEA stapler was advanced through the rectal stump.  A anastomosis was created under laparoscopic visualization.  There was no tension noted.  There was no leak when tested with insufflation under irrigation.  The abdomen was inspected.  Hemostasis was good.  We then switched to clean gowns, gloves, instruments and drapes.    Peritoneum of the Pfannenstiel incision was closed using a running 0 Vicryl suture.  The fascia was closed using running #1 Novafil sutures x2.  Subcutaneous tissue was closed using a running 2-0 Vicryl suture.  The skin was closed using a running 4-0 Vicryl subcuticular suture.  Sterile dressing was applied.  The remaining port sites were closed using interrupted 4-0 Vicryl sutures and Dermabond.  The patient was then awakened from anesthesia and sent to the postanesthesia care unit in stable condition.  All counts  were correct per operating room staff.  An MD assistant was necessary for tissue manipulation, retraction and positioning due to the complexity of the case and hospital policies   Rosario Adie, MD  [facility]

## 2022-09-08 NOTE — Anesthesia Procedure Notes (Signed)
Procedure Name: Intubation Date/Time: 09/08/2022 11:22 AM  Performed by: Claudia Desanctis, CRNAPre-anesthesia Checklist: Patient identified, Emergency Drugs available, Suction available and Patient being monitored Patient Re-evaluated:Patient Re-evaluated prior to induction Oxygen Delivery Method: Circle system utilized Preoxygenation: Pre-oxygenation with 100% oxygen Induction Type: IV induction Ventilation: Mask ventilation without difficulty Laryngoscope Size: Miller and 3 Grade View: Grade I Tube type: Oral Tube size: 7.0 mm Number of attempts: 1 Airway Equipment and Method: Stylet Placement Confirmation: ETT inserted through vocal cords under direct vision, positive ETCO2 and breath sounds checked- equal and bilateral Secured at: 21 cm Tube secured with: Tape Dental Injury: Teeth and Oropharynx as per pre-operative assessment

## 2022-09-08 NOTE — Anesthesia Postprocedure Evaluation (Signed)
Anesthesia Post Note  Patient: Sylvia Garcia  Procedure(s) Performed: XI ROBOT ASSISTED PARTIAL COLECTOMY CYSTOSCOPY with FIREFLY INJECTION     Patient location during evaluation: PACU Anesthesia Type: General Level of consciousness: awake Pain management: pain level controlled Vital Signs Assessment: post-procedure vital signs reviewed and stable Respiratory status: spontaneous breathing, nonlabored ventilation, respiratory function stable and patient connected to nasal cannula oxygen Cardiovascular status: blood pressure returned to baseline and stable Postop Assessment: no apparent nausea or vomiting Anesthetic complications: no   No notable events documented.  Last Vitals:  Vitals:   09/08/22 1615 09/08/22 1644  BP: (!) 124/93 (!) 134/93  Pulse: (!) 34 99  Resp: 14 18  Temp:  36.7 C  SpO2: 100% 100%    Last Pain:  Vitals:   09/08/22 1644  TempSrc: Oral  PainSc:                  Adonias Demore P Genine Beckett

## 2022-09-08 NOTE — H&P (Signed)
REFERRING PHYSICIAN:  Eleanora Neighbor, MD   PROVIDER:  Elenora Gamma, MD   MRN: [id number] DOB: [birth year]   Subjective    Chief Complaint: Diverticulitis       History of Present Illness: Sylvia Garcia is a 36 y.o. female who is seen today as an office consultation at the request of Dr. Cloretta Ned for evaluation of Diverticulitis .  Patient with history of recurrent diverticulitis.  Most recent bout was in April 2023 and complicated by abscess, treated with IV antibiotics.  First bout of diverticulitis was in July 2022.  This resolved with antibiotics.  Another episode occurred in August and then in November.  In April she required hospitalization for 1 week due to abscess.  Colonoscopy in February 2023 showed left-sided diverticulosis with no malignancy.  TI was intubated and felt to be normal as well.       Review of Systems: A complete review of systems was obtained from the patient.  I have reviewed this information and discussed as appropriate with the patient.  See HPI as well for other ROS.     Medical History: Past Medical History  History reviewed. No pertinent past medical history.      There is no problem list on file for this patient.     Past Surgical History  History reviewed. No pertinent surgical history.      Allergies  Not on File                 Current Outpatient Medications on File Prior to Visit  Medication Sig Dispense Refill   acyclovir (ZOVIRAX) 400 MG tablet Take 400 mg by mouth 2 (two) times daily       dextroamphetamine-amphetamine (ADDERALL XR) 10 MG XR capsule Take 20 mg by mouth once daily        No current facility-administered medications on file prior to visit.      Family History           Family History  Problem Relation Age of Onset   High blood pressure (Hypertension) Mother     Heart valve disease Mother     Deep vein thrombosis (DVT or abnormal blood clot formation) Father          Social History            Tobacco Use  Smoking Status Every Day   Types: Cigarettes  Smokeless Tobacco Never      Social History  Social History             Socioeconomic History   Marital status: Unknown  Tobacco Use   Smoking status: Every Day      Types: Cigarettes   Smokeless tobacco: Never  Vaping Use   Vaping Use: Never used  Substance and Sexual Activity   Alcohol use: Yes   Drug use: Never        Objective:      Vitals:   09/08/22 1047  BP: 95/68  Pulse: 76  Resp: 16  Temp: 98.4 F (36.9 C)  SpO2: 100%      Exam Gen: NAD CV: RRR Lungs: CTA Abd: soft       Labs, Imaging and Diagnostic Testing: CT reviewed.  Patient has a persistently thickened sigmoid colon.   Assessment and Plan:  Diagnoses and all orders for this visit:   Diverticular disease     36 year old female with recurrent diverticulitis and history of complicated diverticulitis in April 2023.  After discussing this with her and  reviewing her CT images and history, I think it would be reasonable to perform sigmoidectomy to help prevent future complications of her diverticular disease.  We have discussed this in detail including risk of bleeding, infection and damage to adjacent structures.  We also discussed the risk of fertility issues with pelvic surgery.  All questions were answered.     The surgery and anatomy were described to the patient as well as the risks of surgery and the possible complications.  These include: Bleeding, deep abdominal infections and possible wound complications such as hernia and infection, damage to adjacent structures, leak of surgical connections, which can lead to other surgeries and possibly an ostomy, possible need for other procedures, such as abscess drains in radiology, possible prolonged hospital stay, possible diarrhea from removal of part of the colon, possible constipation from narcotics, possible bowel, bladder or sexual dysfunction if having rectal surgery, prolonged  fatigue/weakness or appetite loss, possible early recurrence of of disease, possible complications of their medical problems such as heart disease or arrhythmias or lung problems, death (less than 1%). I believe the patient understands and wishes to proceed with the surgery.   Rosario Adie, MD  [facility]

## 2022-09-08 NOTE — Transfer of Care (Signed)
Immediate Anesthesia Transfer of Care Note  Patient: Sylvia Garcia  Procedure(s) Performed: XI ROBOT ASSISTED PARTIAL COLECTOMY CYSTOSCOPY with FIREFLY INJECTION  Patient Location: PACU  Anesthesia Type:General  Level of Consciousness: awake and patient cooperative  Airway & Oxygen Therapy: Patient Spontanous Breathing and Patient connected to face mask  Post-op Assessment: Report given to RN and Post -op Vital signs reviewed and stable  Post vital signs: Reviewed and stable  Last Vitals:  Vitals Value Taken Time  BP 163/88 09/08/22 1348  Temp    Pulse 36 09/08/22 1349  Resp 12 09/08/22 1349  SpO2 100 % 09/08/22 1349  Vitals shown include unvalidated device data.  Last Pain:  Vitals:   09/08/22 1107  TempSrc:   PainSc: 0-No pain         Complications: No notable events documented.

## 2022-09-09 LAB — BASIC METABOLIC PANEL
Anion gap: 7 (ref 5–15)
BUN: 11 mg/dL (ref 6–20)
CO2: 24 mmol/L (ref 22–32)
Calcium: 7.9 mg/dL — ABNORMAL LOW (ref 8.9–10.3)
Chloride: 105 mmol/L (ref 98–111)
Creatinine, Ser: 1.06 mg/dL — ABNORMAL HIGH (ref 0.44–1.00)
GFR, Estimated: >60 mL/min (ref >60)
Glucose, Bld: 98 mg/dL (ref 70–99)
Potassium: 3.5 mmol/L (ref 3.5–5.1)
Sodium: 136 mmol/L (ref 135–145)

## 2022-09-09 LAB — CBC
HCT: 33.6 % — ABNORMAL LOW (ref 36.0–46.0)
Hemoglobin: 11.1 g/dL — ABNORMAL LOW (ref 12.0–15.0)
MCH: 29.5 pg (ref 26.0–34.0)
MCHC: 33 g/dL (ref 30.0–36.0)
MCV: 89.4 fL (ref 80.0–100.0)
Platelets: 413 10*3/uL — ABNORMAL HIGH (ref 150–400)
RBC: 3.76 MIL/uL — ABNORMAL LOW (ref 3.87–5.11)
RDW: 13.9 % (ref 11.5–15.5)
WBC: 7 10*3/uL (ref 4.0–10.5)
nRBC: 0 % (ref 0.0–0.2)

## 2022-09-09 MED ORDER — TRAMADOL HCL 50 MG PO TABS
50.0000 mg | ORAL_TABLET | Freq: Four times a day (QID) | ORAL | Status: DC | PRN
  Administered 2022-09-09 – 2022-09-12 (×4): 100 mg via ORAL
  Filled 2022-09-09 (×4): qty 2

## 2022-09-09 NOTE — Plan of Care (Signed)

## 2022-09-09 NOTE — Progress Notes (Signed)
1 Day Post-Op Robotic Sigmoidectomy Subjective: Pain controlled.  Tolerating clears, ambulating some  Objective: Vital signs in last 24 hours: Temp:  [97.6 F (36.4 C)-98.9 F (37.2 C)] 98.2 F (36.8 C) (11/03 0548) Pulse Rate:  [34-99] 58 (11/03 0548) Resp:  [9-18] 18 (11/03 0548) BP: (94-163)/(57-105) 99/59 (11/03 0548) SpO2:  [98 %-100 %] 100 % (11/03 0548) Weight:  [63 kg-65.6 kg] 65.6 kg (11/03 0500)   Intake/Output from previous day: 11/02 0701 - 11/03 0700 In: 2388 [P.O.:450; I.V.:1838; IV Piggyback:100] Out: 345 [Urine:245; Blood:100] Intake/Output this shift: No intake/output data recorded.   General appearance: alert and cooperative GI: soft  Incision: no significant drainage  Lab Results:  Recent Labs    09/09/22 0447  WBC 7.0  HGB 11.1*  HCT 33.6*  PLT 413*   BMET Recent Labs    09/08/22 1058 09/09/22 0447  NA 137 136  K 3.3* 3.5  CL 108 105  CO2 20* 24  GLUCOSE 149* 98  BUN 9 11  CREATININE 0.90 1.06*  CALCIUM 8.9 7.9*   PT/INR No results for input(s): "LABPROT", "INR" in the last 72 hours. ABG No results for input(s): "PHART", "HCO3" in the last 72 hours.  Invalid input(s): "PCO2", "PO2"  MEDS, Scheduled  acetaminophen  1,000 mg Oral Q6H   acyclovir  800 mg Oral Daily   alvimopan  12 mg Oral BID   amphetamine-dextroamphetamine  30 mg Oral Daily   enoxaparin (LOVENOX) injection  40 mg Subcutaneous Q24H   feeding supplement  237 mL Oral BID BM   gabapentin  300 mg Oral BID   saccharomyces boulardii  250 mg Oral BID    Studies/Results: No results found.  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

## 2022-09-10 LAB — BASIC METABOLIC PANEL
Anion gap: 3 — ABNORMAL LOW (ref 5–15)
BUN: 7 mg/dL (ref 6–20)
CO2: 24 mmol/L (ref 22–32)
Calcium: 8 mg/dL — ABNORMAL LOW (ref 8.9–10.3)
Chloride: 108 mmol/L (ref 98–111)
Creatinine, Ser: 0.79 mg/dL (ref 0.44–1.00)
GFR, Estimated: >60 mL/min (ref >60)
Glucose, Bld: 96 mg/dL (ref 70–99)
Potassium: 3.5 mmol/L (ref 3.5–5.1)
Sodium: 135 mmol/L (ref 135–145)

## 2022-09-10 LAB — CBC
HCT: 31 % — ABNORMAL LOW (ref 36.0–46.0)
Hemoglobin: 10.4 g/dL — ABNORMAL LOW (ref 12.0–15.0)
MCH: 30 pg (ref 26.0–34.0)
MCHC: 33.5 g/dL (ref 30.0–36.0)
MCV: 89.3 fL (ref 80.0–100.0)
Platelets: 359 10*3/uL (ref 150–400)
RBC: 3.47 MIL/uL — ABNORMAL LOW (ref 3.87–5.11)
RDW: 13.6 % (ref 11.5–15.5)
WBC: 6.9 10*3/uL (ref 4.0–10.5)
nRBC: 0 % (ref 0.0–0.2)

## 2022-09-10 NOTE — Progress Notes (Signed)
2 Days Post-Op Robotic Sigmoidectomy Subjective: Pain controlled.  Tolerating clears, ambulating some, having some nausea  Objective: Vital signs in last 24 hours: Temp:  [97.8 F (36.6 C)-98.7 F (37.1 C)] 97.8 F (36.6 C) (11/04 0530) Pulse Rate:  [57-63] 57 (11/04 0530) Resp:  [16-18] 16 (11/04 0530) BP: (92-98)/(60-67) 95/61 (11/04 0530) SpO2:  [97 %-100 %] 98 % (11/04 0530)   Intake/Output from previous day: 11/03 0701 - 11/04 0700 In: 2240.8 [P.O.:840; I.V.:1400.8] Out: 450 [Urine:450] Intake/Output this shift: No intake/output data recorded.   General appearance: alert and cooperative GI: soft , mild distention  Incision: no significant drainage  Lab Results:  Recent Labs    09/09/22 0447  WBC 7.0  HGB 11.1*  HCT 33.6*  PLT 413*    BMET Recent Labs    09/08/22 1058 09/09/22 0447  NA 137 136  K 3.3* 3.5  CL 108 105  CO2 20* 24  GLUCOSE 149* 98  BUN 9 11  CREATININE 0.90 1.06*  CALCIUM 8.9 7.9*    PT/INR No results for input(s): "LABPROT", "INR" in the last 72 hours. ABG No results for input(s): "PHART", "HCO3" in the last 72 hours.  Invalid input(s): "PCO2", "PO2"  MEDS, Scheduled  acetaminophen  1,000 mg Oral Q6H   acyclovir  800 mg Oral Daily   alvimopan  12 mg Oral BID   amphetamine-dextroamphetamine  30 mg Oral Daily   enoxaparin (LOVENOX) injection  40 mg Subcutaneous Q24H   feeding supplement  237 mL Oral BID BM   gabapentin  300 mg Oral BID   saccharomyces boulardii  250 mg Oral BID    Studies/Results: No results found.  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

## 2022-09-11 LAB — CBC
HCT: 32 % — ABNORMAL LOW (ref 36.0–46.0)
Hemoglobin: 10.6 g/dL — ABNORMAL LOW (ref 12.0–15.0)
MCH: 29.5 pg (ref 26.0–34.0)
MCHC: 33.1 g/dL (ref 30.0–36.0)
MCV: 89.1 fL (ref 80.0–100.0)
Platelets: 379 10*3/uL (ref 150–400)
RBC: 3.59 MIL/uL — ABNORMAL LOW (ref 3.87–5.11)
RDW: 13.4 % (ref 11.5–15.5)
WBC: 7 10*3/uL (ref 4.0–10.5)
nRBC: 0 % (ref 0.0–0.2)

## 2022-09-11 LAB — BASIC METABOLIC PANEL
Anion gap: 3 — ABNORMAL LOW (ref 5–15)
BUN: 5 mg/dL — ABNORMAL LOW (ref 6–20)
CO2: 25 mmol/L (ref 22–32)
Calcium: 8 mg/dL — ABNORMAL LOW (ref 8.9–10.3)
Chloride: 105 mmol/L (ref 98–111)
Creatinine, Ser: 0.73 mg/dL (ref 0.44–1.00)
GFR, Estimated: >60 mL/min (ref >60)
Glucose, Bld: 94 mg/dL (ref 70–99)
Potassium: 3.5 mmol/L (ref 3.5–5.1)
Sodium: 133 mmol/L — ABNORMAL LOW (ref 135–145)

## 2022-09-11 MED ORDER — TRAMADOL HCL 50 MG PO TABS: 50.0000 mg | ORAL_TABLET | Freq: Four times a day (QID) | ORAL | 0 refills | Status: DC | PRN

## 2022-09-11 NOTE — Discharge Instructions (Signed)
SURGERY: POST OP INSTRUCTIONS (Surgery for small bowel obstruction, colon resection, etc)   ######################################################################  EAT Gradually transition to a high fiber diet with a fiber supplement over the next few days after discharge  WALK Walk an hour a day.  Control your pain to do that.    CONTROL PAIN Control pain so that you can walk, sleep, tolerate sneezing/coughing, go up/down stairs.  HAVE A BOWEL MOVEMENT DAILY Keep your bowels regular to avoid problems.  OK to try a laxative to override constipation.  OK to use an antidairrheal to slow down diarrhea.  Call if not better after 2 tries  CALL IF YOU HAVE PROBLEMS/CONCERNS Call if you are still struggling despite following these instructions. Call if you have concerns not answered by these instructions  ######################################################################   DIET Follow a light diet the first few days at home.  Start with a bland diet such as soups, liquids, starchy foods, low fat foods, etc.  If you feel full, bloated, or constipated, stay on a ful liquid or pureed/blenderized diet for a few days until you feel better and no longer constipated. Be sure to drink plenty of fluids every day to avoid getting dehydrated (feeling dizzy, not urinating, etc.). Gradually add a fiber supplement to your diet over the next week.  Gradually get back to a regular solid diet.  Avoid fast food or heavy meals the first week as you are more likely to get nauseated. It is expected for your digestive tract to need a few months to get back to normal.  It is common for your bowel movements and stools to be irregular.  You will have occasional bloating and cramping that should eventually fade away.  Until you are eating solid food normally, off all pain medications, and back to regular activities; your bowels will not be normal. Focus on eating a low-fat, high fiber diet the rest of your life  (See Getting to Good Bowel Health, below).  CARE of your INCISION or WOUND  It is good for closed incisions and even open wounds to be washed every day.  Shower every day.  Short baths are fine.  Wash the incisions and wounds clean with soap & water.    You may leave closed incisions open to air if it is dry.   You may cover the incision with clean gauze & replace it after your daily shower for comfort.  STAPLES: You have skin staples.  Leave them in place & set up an appointment for them to be removed by a surgery office nurse ~10 days after surgery. = 1st week of January 2024    ACTIVITIES as tolerated Start light daily activities --- self-care, walking, climbing stairs-- beginning the day after surgery.  Gradually increase activities as tolerated.  Control your pain to be active.  Stop when you are tired.  Ideally, walk several times a day, eventually an hour a day.   Most people are back to most day-to-day activities in a few weeks.  It takes 4-8 weeks to get back to unrestricted, intense activity. If you can walk 30 minutes without difficulty, it is safe to try more intense activity such as jogging, treadmill, bicycling, low-impact aerobics, swimming, etc. Save the most intensive and strenuous activity for last (Usually 4-8 weeks after surgery) such as sit-ups, heavy lifting, contact sports, etc.  Refrain from any intense heavy lifting or straining until you are off narcotics for pain control.  You will have off days, but things should improve   week-by-week. DO NOT PUSH THROUGH PAIN.  Let pain be your guide: If it hurts to do something, don't do it.  Pain is your body warning you to avoid that activity for another week until the pain goes down. You may drive when you are no longer taking narcotic prescription pain medication, you can comfortably wear a seatbelt, and you can safely make sudden turns/stops to protect yourself without hesitating due to pain. You may have sexual intercourse when it  is comfortable. If it hurts to do something, stop.  MEDICATIONS Take your usually prescribed home medications unless otherwise directed.   Blood thinners:  Usually you can restart any strong blood thinners after the second postoperative day.  It is OK to take aspirin right away.     If you are on strong blood thinners (warfarin/Coumadin, Plavix, Xerelto, Eliquis, Pradaxa, etc), discuss with your surgeon, medicine PCP, and/or cardiologist for instructions on when to restart the blood thinner & if blood monitoring is needed (PT/INR blood check, etc).     PAIN CONTROL Pain after surgery or related to activity is often due to strain/injury to muscle, tendon, nerves and/or incisions.  This pain is usually short-term and will improve in a few months.  To help speed the process of healing and to get back to regular activity more quickly, DO THE FOLLOWING THINGS TOGETHER: Increase activity gradually.  DO NOT PUSH THROUGH PAIN Use Ice and/or Heat Try Gentle Massage and/or Stretching Take over the counter pain medication Take Narcotic prescription pain medication for more severe pain  Good pain control = faster recovery.  It is better to take more medicine to be more active than to stay in bed all day to avoid medications.  Increase activity gradually Avoid heavy lifting at first, then increase to lifting as tolerated over the next 6 weeks. Do not "push through" the pain.  Listen to your body and avoid positions and maneuvers than reproduce the pain.  Wait a few days before trying something more intense Walking an hour a day is encouraged to help your body recover faster and more safely.  Start slowly and stop when getting sore.  If you can walk 30 minutes without stopping or pain, you can try more intense activity (running, jogging, aerobics, cycling, swimming, treadmill, sex, sports, weightlifting, etc.) Remember: If it hurts to do it, then don't do it! Use Ice and/or Heat You will have swelling and  bruising around the incisions.  This will take several weeks to resolve. Ice packs or heating pads (6-8 times a day, 30-60 minutes at a time) will help sooth soreness & bruising. Some people prefer to use ice alone, heat alone, or alternate between ice & heat.  Experiment and see what works best for you.  Consider trying ice for the first few days to help decrease swelling and bruising; then, switch to heat to help relax sore spots and speed recovery. Shower every day.  Short baths are fine.  It feels good!  Keep the incisions and wounds clean with soap & water.   Try Gentle Massage and/or Stretching Massage at the area of pain many times a day Stop if you feel pain - do not overdo it Take over the counter pain medication This helps the muscle and nerve tissues become less irritable and calm down faster Choose ONE of the following over-the-counter anti-inflammatory medications: Acetaminophen 500mg tabs (Tylenol) 1-2 pills with every meal and just before bedtime (avoid if you have liver problems or if you have   acetaminophen in you narcotic prescription) Naproxen 220mg tabs (ex. Aleve, Naprosyn) 1-2 pills twice a day (avoid if you have kidney, stomach, IBD, or bleeding problems) Ibuprofen 200mg tabs (ex. Advil, Motrin) 3-4 pills with every meal and just before bedtime (avoid if you have kidney, stomach, IBD, or bleeding problems) Take with food/snack several times a day as directed for at least 2 weeks to help keep pain / soreness down & more manageable. Take Narcotic prescription pain medication for more severe pain A prescription for strong pain control is often given to you upon discharge (for example: oxycodone/Percocet, hydrocodone/Norco/Vicodin, or tramadol/Ultram) Take your pain medication as prescribed. Be mindful that most narcotic prescriptions contain Tylenol (acetaminophen) as well - avoid taking too much Tylenol. If you are having problems/concerns with the prescription medicine (does  not control pain, nausea, vomiting, rash, itching, etc.), please call us [phone] to see if we need to switch you to a different pain medicine that will work better for you and/or control your side effects better. If you need a refill on your pain medication, you must call the office before 4 pm and on weekdays only.  By federal law, prescriptions for narcotics cannot be called into a pharmacy.  They must be filled out on paper & picked up from our office by the patient or authorized caretaker.  Prescriptions cannot be filled after 4 pm nor on weekends.    WHEN TO CALL US [phone] Severe uncontrolled or worsening pain  Fever over 101 F (38.5 C) Concerns with the incision: Worsening pain, redness, rash/hives, swelling, bleeding, or drainage Reactions / problems with new medications (itching, rash, hives, nausea, etc.) Nausea and/or vomiting Difficulty urinating Difficulty breathing Worsening fatigue, dizziness, lightheadedness, blurred vision Other concerns If you are not getting better after two weeks or are noticing you are getting worse, contact our office [phone] for further advice.  We may need to adjust your medications, re-evaluate you in the office, send you to the emergency room, or see what other things we can do to help. The clinic staff is available to answer your questions during regular business hours (8:30am-5pm).  Please don't hesitate to call and ask to speak to one of our nurses for clinical concerns.    A surgeon from [facility] is always on call at the hospitals 24 hours/day If you have a medical emergency, go to the nearest emergency room or call 911.  FOLLOW UP in our office One the day of your discharge from the hospital (or the next business weekday), please call [facility] to set up or confirm an appointment to see your surgeon in the office for a follow-up appointment.  Usually it is 2-3 weeks after your surgery.   If you  have skin staples at your incision(s), let the office know so we can set up a time in the office for the nurse to remove them (usually around 10 days after surgery). Make sure that you call for appointments the day of discharge (or the next business weekday) from the hospital to ensure a convenient appointment time. IF YOU HAVE DISABILITY OR FAMILY LEAVE FORMS, BRING THEM TO THE OFFICE FOR PROCESSING.  DO NOT GIVE THEM TO YOUR DOCTOR.  [facility] [address], [address], [place], [place]  [address] ? [phone] - Main [phone] - Toll Free,  [phone] - Fax [email or web address]    GETTING TO GOOD BOWEL HEALTH. It is expected for your digestive tract to   need a few months to get back to normal.  It is common for your bowel movements and stools to be irregular.  You will have occasional bloating and cramping that should eventually fade away.  Until you are eating solid food normally, off all pain medications, and back to regular activities; your bowels will not be normal.   Avoiding constipation The goal: ONE SOFT BOWEL MOVEMENT A DAY!    Drink plenty of fluids.  Choose water first. TAKE A FIBER SUPPLEMENT EVERY DAY THE REST OF YOUR LIFE During your first week back home, gradually add back a fiber supplement every day Experiment which form you can tolerate.   There are many forms such as powders, tablets, wafers, gummies, etc Psyllium bran (Metamucil), methylcellulose (Citrucel), Miralax or Glycolax, Benefiber, Flax Seed.  Adjust the dose week-by-week (1/2 dose/day to 6 doses a day) until you are moving your bowels 1-2 times a day.  Cut back the dose or try a different fiber product if it is giving you problems such as diarrhea or bloating. Sometimes a laxative is needed to help jump-start bowels if constipated until the fiber supplement can help regulate your bowels.  If you are tolerating eating & you are farting, it is okay to try a gentle  laxative such as double dose MiraLax, prune juice, or Milk of Magnesia.  Avoid using laxatives too often. Stool softeners can sometimes help counteract the constipating effects of narcotic pain medicines.  It can also cause diarrhea, so avoid using for too long. If you are still constipated despite taking fiber daily, eating solids, and a few doses of laxatives, call our office. Controlling diarrhea Try drinking liquids and eating bland foods for a few days to avoid stressing your intestines further. Avoid dairy products (especially milk & ice cream) for a short time.  The intestines often can lose the ability to digest lactose when stressed. Avoid foods that cause gassiness or bloating.  Typical foods include beans and other legumes, cabbage, broccoli, and dairy foods.  Avoid greasy, spicy, fast foods.  Every person has some sensitivity to other foods, so listen to your body and avoid those foods that trigger problems for you. Probiotics (such as active yogurt, Align, etc) may help repopulate the intestines and colon with normal bacteria and calm down a sensitive digestive tract Adding a fiber supplement gradually can help thicken stools by absorbing excess fluid and retrain the intestines to act more normally.  Slowly increase the dose over a few weeks.  Too much fiber too soon can backfire and cause cramping & bloating. It is okay to try and slow down diarrhea with a few doses of antidiarrheal medicines.   Bismuth subsalicylate (ex. Kayopectate, Pepto Bismol) for a few doses can help control diarrhea.  Avoid if pregnant.   Loperamide (Imodium) can slow down diarrhea.  Start with one tablet (2mg) first.  Avoid if you are having fevers or severe pain.  ILEOSTOMY PATIENTS WILL HAVE CHRONIC DIARRHEA since their colon is not in use.    Drink plenty of liquids.  You will need to drink even more glasses of water/liquid a day to avoid getting dehydrated. Record output from your ileostomy.  Expect to empty  the bag every 3-4 hours at first.  Most people with a permanent ileostomy empty their bag 4-6 times at the least.   Use antidiarrheal medicine (especially Imodium) several times a day to avoid getting dehydrated.  Start with a dose at bedtime & breakfast.  Adjust up or   down as needed.  Increase antidiarrheal medications as directed to avoid emptying the bag more than 8 times a day (every 3 hours). Work with your wound ostomy nurse to learn care for your ostomy.  See ostomy care instructions. TROUBLESHOOTING IRREGULAR BOWELS 1) Start with a soft & bland diet. No spicy, greasy, or fried foods.  2) Avoid gluten/wheat or dairy products from diet to see if symptoms improve. 3) Miralax 17gm or flax seed mixed in 8oz. water or juice-daily. May use 2-4 times a day as needed. 4) Gas-X, Phazyme, etc. as needed for gas & bloating.  5) Prilosec (omeprazole) over-the-counter as needed 6)  Consider probiotics (Align, Activa, etc) to help calm the bowels down  Call your doctor if you are getting worse or not getting better.  Sometimes further testing (cultures, endoscopy, X-ray studies, CT scans, bloodwork, etc.) may be needed to help diagnose and treat the cause of the diarrhea. [facility] [address], [address], [place], [place]  [address] [phone] - Main.    [phone]  - Toll Free.   [phone] - Fax [email or web address]   ###############################   #######################################################  Ostomy Support Information  You've heard that people get along just fine with only one of their eyes, or one of their lungs, or one of their kidneys. But you also know that you have only one intestine and only one bladder, and that leaves you feeling awfully empty, both physically and emotionally: You think no other people go around without part of their intestine with the ends of their intestines sticking out through their abdominal walls.    YOU ARE NOT ALONE.  There are nearly three quarters of a million people in the [place] who have an ostomy; people who have had surgery to remove all or part of their colons or bladders.   There is even a national association, the [employer] with over 350 local affiliated support groups that are organized by volunteers who provide peer support and counseling. [employer] has a toll free telephone num-ber, 800-826-0826 and an educational, interactive website, www.ostomy.org   An ostomy is an opening in the belly (abdominal wall) made by surgery. Ostomates are people who have had this procedure. The opening (stoma) allows the kidney or bowel to grdischarge waste. An external pouch covers the stoma to collect waste. Pouches are are a simple bag and are odor free. Different companies have disposable or reusable pouches to fit one's lifestyle. An ostomy can either be temporary or permanent.   THERE ARE THREE MAIN TYPES OF OSTOMIES Colostomy. A colostomy is a surgically created opening in the large intestine (colon). Ileostomy. An ileostomy is a surgically created opening in the small intestine. Urostomy. A urostomy is a surgically created opening to divert urine away from the bladder.  OSTOMY Care  The following guidelines will make care of your colostomy easier. Keep this information close by for quick reference.  Helpful DIET hints Eat a well-balanced diet including vegetables and fresh fruits. Eat on a regular schedule.  Drink at least 6 to 8 glasses of fluids daily. Eat slowly in a relaxed atmosphere. Chew your food thoroughly. Avoid chewing gum, smoking, and drinking from a straw. This will help decrease the amount of air you swallow, which may help reduce gas. Eating yogurt or drinking buttermilk may help reduce gas.  To control gas at night, do not eat after 8 p.m. This will give your bowel time to quiet down before you go   to bed.  If gas is a problem, you can purchase  Beano. Sprinkle Beano on the first bite of food before eating to reduce gas. It has no flavor and should not change the taste of your food. You can buy Beano over the counter at your local drugstore.  Foods like fish, onions, garlic, broccoli, asparagus, and cabbage produce odor. Although your pouch is odor-proof, if you eat these foods you may notice a stronger odor when emptying your pouch. If this is a concern, you may want to limit these foods in your diet.  If you have an ileostomy, you will have chronic diarrhea & need to drink more liquids to avoid getting dehydrated.  Consider antidiarrheal medicine like imodium (loperamide) or Lomotil to help slow down bowel movements / diarrhea into your ileostomy bag.  GETTING TO GOOD BOWEL HEALTH WITH AN ILEOSTOMY    With the colon bypassed & not in use, you will have small bowel diarrhea.   It is important to thicken & slow your bowel movements down.   The goal: 4-6 small BOWEL MOVEMENTS A DAY It is important to drink plenty of liquids to avoid getting dehydrated  CONTROLLING ILEOSTOMY DIARRHEA  TAKE A FIBER SUPPLEMENT (FiberCon or Benefiner soluble fiber) twice a day - to thicken stools by absorbing excess fluid and retrain the intestines to act more normally.  Slowly increase the dose over a few weeks.  Too much fiber too soon can backfire and cause cramping & bloating.  TAKE AN IRON SUPPLEMENT twice a day to naturally constipate your bowels.  Usually ferrous sulfate 325mg twice a day)  TAKE ANTI-DIARRHEAL MEDICINES: Loperamide (Imodium) can slow down diarrhea.  Start with two tablets (= 4mg) first and then try one tablet every 6 hours.  Can go up to 2 pills four times day (8 pills of 2mg max) Avoid if you are having fevers or severe pain.  If you are not better or start feeling worse, stop all medicines and call your doctor for advice LoMotil (Diphenoxylate / Atropine) is another medicine that can constipate & slow down bowel moevements Pepto  Bismol (bismuth) can gently thicken bowels as well  If diarrhea is worse,: drink plenty of liquids and try simpler foods for a few days to avoid stressing your intestines further. Avoid dairy products (especially milk & ice cream) for a short time.  The intestines often can lose the ability to digest lactose when stressed. Avoid foods that cause gassiness or bloating.  Typical foods include beans and other legumes, cabbage, broccoli, and dairy foods.  Every person has some sensitivity to other foods, so listen to our body and avoid those foods that trigger problems for you.Call your doctor if you are getting worse or not better.  Sometimes further testing (cultures, endoscopy, X-ray studies, bloodwork, etc) may be needed to help diagnose and treat the cause of the diarrhea. Take extra anti-diarrheal medicines (maximum is 8 pills of 2mg loperamide a day)   Tips for POUCHING an OSTOMY   Changing Your Pouch The best time to change your pouch is in the morning, before eating or drinking anything. Your stoma can function at any time, but it will function more after eating or drinking.   Applying the pouching system  Place all your equipment close at hand before removing your pouch.  Wash your hands.  Stand or sit in front of a mirror. Use the position that works best for you. Remember that you must keep the skin around the stoma   wrinkle-free for a good seal.  Gently remove the used pouch (1-piece system) or the pouch and old wafer (2-piece system). Empty the pouch into the toilet. Save the closure clip to use again.  Wash the stoma itself and the skin around the stoma. Your stoma may bleed a little when being washed. This is normal. Rinse and pat dry. You may use a wash cloth or soft paper towels (like Bounty), mild soap (like Dial, Safeguard, or Ivory), and water. Avoid soaps that contain perfumes or lotions.  For a new pouch (1-piece system) or a new wafer (2-piece system), measure your  stoma using the stoma guide in each box of supplies.  Trace the shape of your stoma onto the back of the new pouch or the back of the new wafer. Cut out the opening. Remove the paper backing and set it aside.  Optional: Apply a skin barrier powder to surrounding skin if it is irritated (bare or weeping), and dust off the excess. Optional: Apply a skin-prep wipe (such as Skin Prep or All-Kare) to the skin around the stoma, and let it dry. Do not apply this solution if the skin is irritated (red, tender, or broken) or if you have shaved around the stoma. Optional: Apply a skin barrier paste (such as Stomahesive, Coloplast, or Premium) around the opening cut in the back of the pouch or wafer. Allow it to dry for 30 to 60 seconds.  Hold the pouch (1-piece system) or wafer (2-piece system) with the sticky side toward your body. Make sure the skin around the stoma is wrinkle-free. Center the opening on the stoma, then press firmly to your abdomen (Fig. 4). Look in the mirror to check if you are placing the pouch, or wafer, in the right position. For a 2-piece system, snap the pouch onto the wafer. Make sure it snaps into place securely.  Place your hand over the stoma and the pouch or wafer for about 30 seconds. The heat from your hand can help the pouch or wafer stick to your skin.  Add deodorant (such as Super Banish or Nullo) to your pouch. Other options include food extracts such as vanilla oil and peppermint extract. Add about 10 drops of the deodorant to the pouch. Then apply the closure clamp. Note: Do not use toxic  chemicals or commercial cleaning agents in your pouch. These substances may harm the stoma.  Optional: For extra seal, apply tape to all 4 sides around the pouch or wafer, as if you were framing a picture. You may use any brand of medical adhesive tape. Change your pouch every 5 to 7 days. Change it immediately if a leak occurs.  Wash your hands afterwards.  If you are wearing a  2-piece system, you may use 2 new pouches per week and alternate them. Rinse the pouch with mild soap and warm water and hang it to dry for the next day. Apply the fresh pouch. Alternate the 2 pouches like this for a week. After a week, change the wafer and begin with 2 new pouches. Place the old pouches in a plastic bag, and put them in the trash.   LIVING WITH AN OSTOMY  Emptying Your Pouch Empty your pouch when it is one-third full (of urine, stool, and/or gas). If you wait until your pouch is fuller than this, it will be more difficult to empty and more noticeable. When you empty your pouch, either put toilet paper in the toilet bowl first, or flush the   toilet while you empty the pouch. This will reduce splashing. You can empty the pouch between your legs or to one side while sitting, or while standing or stooping. If you have a 2-piece system, you can snap off the pouch to empty it. Remember that your stoma may function during this time. If you wish to rinse your pouch after you empty it, a turkey baster can be helpful. When using a baster, squirt water up into the pouch through the opening at the bottom. With a 2-piece system, you can snap off the pouch to rinse it. After rinsing  your pouch, empty it into the toilet. When rinsing your pouch at home, put a few granules of Dreft soap in the rinse water. This helps lubricate and freshen your pouch. The inside of your pouch can be sprayed with non-stick cooking oil (Pam spray). This may help reduce stool sticking to the inside of the pouch.  Bathing You may shower or bathe with your pouch on or off. Remember that your stoma may function during this time.  The materials you use to wash your stoma and the skin around it should be clean, but they do not need to be sterile.  Wearing Your Pouch During hot weather, or if you perspire a lot in general, wear a cover over your pouch. This may prevent a rash on your skin under the pouch. Pouch covers are  sold at ostomy supply stores. Wear the pouch inside your underwear for better support. Watch your weight. Any gain or loss of 10 to 15 pounds or more can change the way your pouch fits.  Going Away From Home A collapsible cup (like those that come in travel kits) or a soft plastic squirt bottle with a pull-up top (like a travel bottle for shampoo) can be used for rinsing your pouch when you are away from home. Tilt the opening of the pouch at an upward angle when using a cup to rinse.  Carry wet wipes or extra tissues to use in public bathrooms.  Carry an extra pouching system with you at all times.  Never keep ostomy supplies in the glove compartment of your car. Extreme heat or cold can damage the skin barriers and adhesive wafers on the pouch.  When you travel, carry your ostomy supplies with you at all times. Keep them within easy reach. Do not pack ostomy supplies in baggage that will be checked or otherwise separated from you, because your baggage might be lost. If you're traveling out of the country, it is helpful to have a letter stating that you are carrying ostomy supplies as a medical necessity.  If you need ostomy supplies while traveling, look in the yellow pages of the telephone book under "Surgical Supplies." Or call the local ostomy organization to find out where supplies are available.  Do not let your ostomy supplies get low. Always order new pouches before you use the last one.  Reducing Odor Limit foods such as broccoli, cabbage, onions, fish, and garlic in your diet to help reduce odor. Each time you empty your pouch, carefully clean the opening of the pouch, both inside and outside, with toilet paper. Rinse your pouch 1 or 2 times daily after you empty it (see directions for emptying your pouch and going away from home). Add deodorant (such as Super Banish or Nullo) to your pouch. Use air deodorizers in your bathroom. Do not add aspirin to your pouch. Even though  aspirin can help prevent odor, it   could cause ulcers on your stoma.  When to call the doctor Call the doctor if you have any of the following symptoms: Purple, black, or white stoma Severe cramps lasting more than 6 hours Severe watery discharge from the stoma lasting more than 6 hours No output from the colostomy for 3 days Excessive bleeding from your stoma Swelling of your stoma to more than 1/2-inch larger than usual Pulling inward of your stoma below skin level Severe skin irritation or deep ulcers Bulging or other changes in your abdomen  When to call your ostomy nurse Call your ostomy/enterostomal therapy (WOCN) nurse if any of the following occurs: Frequent leaking of your pouching system Change in size or appearance of your stoma, causing discomfort or problems with your pouch Skin rash or rawness Weight gain or loss that causes problems with your pouch     FREQUENTLY ASKED QUESTIONS   Why haven't you met any of these folks who have an ostomy?  Well, maybe you have! You just did not recognize them because an ostomy doesn't show. It can be kept secret if you wish. Why, maybe some of your best friends, office associates or neighbors have an ostomy ... you never can tell. People facing ostomy surgery have many quality-of-life questions like: Will you bulge? Smell? Make noises? Will you feel waste leaving your body? Will you be a captive of the toilet? Will you starve? Be a social outcast? Get/stay married? Have babies? Easily bathe, go swimming, bend over?  OK, let's look at what you can expect:   Will you bulge?  Remember, without part of the intestine or bladder, and its contents, you should have a flatter tummy than before. You can expect to wear, with little exception, what you wore before surgery ... and this in-cludes tight clothing and bathing suits.   Will you smell?  Today, thanks to modern odor proof pouching systems, you can walk into an ostomy support group  meeting and not smell anything that is foul or offensive. And, for those with an ileostomy or colostomy who are concerned about odor when emptying their pouch, there are in-pouch deodorants that can be used to eliminate any waste odors that may exist.   Will you make noises?  Everyone produces gas, especially if they are an air-swallower. But intestinal sounds that occur from time to time are no differ-ent than a gurgling tummy, and quite often your clothing will muffle any sounds.   Will you feel the waste discharges?  For those with a colostomy or ileostomy there might be a slight pressure when waste leaves your body, but understand that the intestines have no nerve endings, so there will be no unpleasant sensations. Those with a urostomy will probably be unaware of any kidney drainage.   Will you be a captive of the toilet?  Immediately post-op you will spend more time in the bathroom than you will after your body recovers from surgery. Every person is different, but on average those with an ileostomy or urostomy may empty their pouches 4 to 6 times a day; a little  less if you have a colostomy. The average wear time between pouch system changes is 3 to 5 days and the changing process should take less than 30 minutes.   Will I need to be on a special diet? Most people return to their normal diet when they have recovered from surgery. Be sure to chew your food well, eat a well-balanced diet and drink plenty of fluids. If   you experience problems with a certain food, wait a couple of weeks and try it again.  Will there be odor and noises? Pouching systems are designed to be odor-proof or odor-resistant. There are deodorants that can be used in the pouch. Medications are also available to help reduce odor. Limit gas-producing foods and carbonated beverages. You will experience less gas and fewer noises as you heal from surgery.  How much time will it take to care for my ostomy? At first, you may  spend a lot of time learning about your ostomy and how to take care of it. As you become more comfortable and skilled at changing the pouching system, it will take very little time to care for it.   Will I be able to return to work? People with ostomies can perform most jobs. As soon as you have healed from surgery, you should be able to return to work. Heavy lifting (more than 10 pounds) may be discouraged.   What about intimacy? Sexual relationships and intimacy are important and fulfilling aspects of your life. They should continue after ostomy surgery. Intimacy-related concerns should be discussed openly between you and your partner.   Can I wear regular clothing? You do not need to wear special clothing. Ostomy pouches are fairly flat and barely noticeable. Elastic undergarments will not hurt the stoma or prevent the ostomy from functioning.   Can I participate in sports? An ostomy should not limit your involvement in sports. Many people with ostomies are runners, skiers, swimmers or participate in other active lifestyles. Talk with your caregiver first before doing heavy physical activity.  Will you starve?  Not if you follow doctor's orders at each stage of your post-op adjustment. There is no such thing as an "ostomy diet". Some people with an ostomy will be able to eat and tolerate anything; others may find diffi-culty with some foods. Each person is an individual and must determine, by trial, what is best for them. A good practice for all is to drink plenty of water.   Will you be a social outcast?  Have you met anyone who has an ostomy and is a social outcast? Why should you be the first? Only your attitude and self image will effect how you are treated. No confi-dent person is an outcast.    PROFESSIONAL HELP   Resources are available if you need help or have questions about your ostomy.   Specially trained nurses called Wound, Ostomy Continence Nurses (WOCN) are available for  consultation in most major medical centers.  Consider getting an ostomy consult at an outpatient ostomy clinic.   [place] has an Ostomy Clinic run by an WOCN ostomy nurse at the [facility].  [phone]. [facility] can help set up an appointment   The [employer] Association (UOA) is a group made up of many local chapters throughout the [place]. These local groups hold meetings and provide support to prospective and existing ostomates. They sponsor educational events and have qualified visitors to make personal or telephone visits. Contact the UOA for the chapter nearest you and for other educational publications.  More detailed information can be found in Colostomy Guide, a publication of the [employer] Ostomy Association (UOA). Contact UOA at 1-800-826-0826 or visit their web site at www.uoaa.org. The website contains links to other sites, suppliers and resources.  [facility] Secure Start Services: Start at the website to enlist for support.  Your Wound Ostomy (WOCN) nurse may have started this   process. https://[email or web address].com/en/securestart Secure Start services are designed to support people as they live their lives with an ostomy or neurogenic bladder. Enrolling is easy and at no cost to the patient. We realize that each person's needs and life journey are different. Through Secure Start services, we want to help people live their life, their way.  #######################################################  

## 2022-09-11 NOTE — Progress Notes (Signed)
3 Days Post-Op Robotic Sigmoidectomy Subjective: Pain controlled.  Having BM's, feels much better  Objective: Vital signs in last 24 hours: Temp:  [98.3 F (36.8 C)-98.6 F (37 C)] 98.6 F (37 C) (11/05 0408) Pulse Rate:  [60-64] 64 (11/05 0408) Resp:  [16-18] 16 (11/05 0408) BP: (101-121)/(66-79) 111/79 (11/05 0408) SpO2:  [99 %-100 %] 100 % (11/05 0408) Weight:  [67.2 kg] 67.2 kg (11/05 0409)   Intake/Output from previous day: 11/04 0701 - 11/05 0700 In: 2105.5 [I.V.:2105.5] Out: -  Intake/Output this shift: No intake/output data recorded.   General appearance: alert and cooperative GI: soft , less distention  Incision: no significant drainage  Lab Results:  Recent Labs    09/10/22 0853 09/11/22 0405  WBC 6.9 7.0  HGB 10.4* 10.6*  HCT 31.0* 32.0*  PLT 359 379    BMET Recent Labs    09/10/22 0853 09/11/22 0405  NA 135 133*  K 3.5 3.5  CL 108 105  CO2 24 25  GLUCOSE 96 94  BUN 7 5*  CREATININE 0.79 0.73  CALCIUM 8.0* 8.0*    PT/INR No results for input(s): "LABPROT", "INR" in the last 72 hours. ABG No results for input(s): "PHART", "HCO3" in the last 72 hours.  Invalid input(s): "PCO2", "PO2"  MEDS, Scheduled  acetaminophen  1,000 mg Oral Q6H   acyclovir  800 mg Oral Daily   enoxaparin (LOVENOX) injection  40 mg Subcutaneous Q24H   feeding supplement  237 mL Oral BID BM   gabapentin  300 mg Oral BID   saccharomyces boulardii  250 mg Oral BID    Studies/Results: No results found.  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

## 2022-09-12 LAB — SURGICAL PATHOLOGY

## 2022-09-12 NOTE — Discharge Summary (Signed)
Physician Discharge Summary  Patient ID: Sylvia Garcia MRN: [id number] DOB/AGE: 12/16/1985 36 y.o.  Admit date: 09/08/2022 Discharge date: 09/12/2022  Admission Diagnoses:  Discharge Diagnoses:  Principal Problem:   Diverticular disease   Discharged Condition: good  Hospital Course: Patient was admitted to the med surg floor after surgery.  Diet was advanced as tolerated.  Patient began to have bowel function on postop day 3.  By postop day 4, she was tolerating a solid diet and pain was controlled with oral medications.  She was urinating without difficulty and ambulating without assistance.  Patient was felt to be in stable condition for discharge to home.   Consults: None  Significant Diagnostic Studies: labs: cbc, bmet  Treatments: IV hydration, analgesia: acetaminophen and Tramadol, and surgery: Robotic Sigmoidectomy  Discharge Exam: Blood pressure 120/85, pulse 69, temperature 98 F (36.7 C), temperature source Oral, resp. rate 15, height 5\' 5"  (1.651 m), weight 67.2 kg, last menstrual period 08/26/2022, SpO2 100 %. General appearance: alert and cooperative GI: normal findings: soft, non-tender Incision/Wound:  Disposition: Discharge disposition: 01-Home or Self Care        Allergies as of 09/12/2022   No Known Allergies      Medication List     TAKE these medications    acyclovir 800 MG tablet Commonly known as: ZOVIRAX Take 800 mg by mouth daily.   amphetamine-dextroamphetamine 30 MG 24 hr capsule Commonly known as: ADDERALL XR Take 30 mg by mouth daily.   GOODY HEADACHE PO Take 1 packet by mouth daily as needed (headaches).   saccharomyces boulardii 250 MG capsule Commonly known as: FLORASTOR Take 500 mg by mouth daily.   traMADol 50 MG tablet Commonly known as: ULTRAM Take 1-2 tablets (50-100 mg total) by mouth every 6 (six) hours as needed for moderate pain.   VITAMIN C GUMMIE PO Take 2 capsules by mouth daily.        Follow-up  Information     Leighton Ruff, MD. Schedule an appointment as soon as possible for a visit in 2 week(s).   Specialties: General Surgery, Colon and Rectal Surgery Contact information: [place] [address] [phone]                 Signed: Rosario Adie 08/07/7509, 7:30 AM

## 2022-09-12 NOTE — Progress Notes (Signed)
Discharge instructions given and all questions were answered.

## 2023-05-15 ENCOUNTER — Emergency Department (HOSPITAL_COMMUNITY): Payer: BLUE CROSS/BLUE SHIELD

## 2023-05-15 ENCOUNTER — Emergency Department (HOSPITAL_COMMUNITY): Payer: Self-pay

## 2023-05-15 ENCOUNTER — Inpatient Hospital Stay (HOSPITAL_COMMUNITY)
Admission: EM | Admit: 2023-05-15 | Discharge: 2023-05-23 | DRG: 963 | Disposition: A | Discharge status: Home / Self Care | Payer: BLUE CROSS/BLUE SHIELD | Attending: General Surgery | Admitting: General Surgery

## 2023-05-15 DIAGNOSIS — S22059A Unspecified fracture of T5-T6 vertebra, initial encounter for closed fracture: Secondary | ICD-10-CM | POA: Diagnosis present

## 2023-05-15 DIAGNOSIS — F909 Attention-deficit hyperactivity disorder, unspecified type: Secondary | ICD-10-CM | POA: Diagnosis present

## 2023-05-15 DIAGNOSIS — S2243XA Multiple fractures of ribs, bilateral, initial encounter for closed fracture: Secondary | ICD-10-CM | POA: Diagnosis present

## 2023-05-15 DIAGNOSIS — S27322A Contusion of lung, bilateral, initial encounter: Secondary | ICD-10-CM | POA: Diagnosis present

## 2023-05-15 DIAGNOSIS — E739 Lactose intolerance, unspecified: Secondary | ICD-10-CM | POA: Diagnosis present

## 2023-05-15 DIAGNOSIS — T07XXXA Unspecified multiple injuries, initial encounter: Secondary | ICD-10-CM

## 2023-05-15 DIAGNOSIS — R000 Tachycardia, unspecified: Secondary | ICD-10-CM | POA: Diagnosis not present

## 2023-05-15 DIAGNOSIS — F4311 Post-traumatic stress disorder, acute: Secondary | ICD-10-CM | POA: Diagnosis not present

## 2023-05-15 DIAGNOSIS — Y92410 Unspecified street and highway as the place of occurrence of the external cause: Secondary | ICD-10-CM

## 2023-05-15 DIAGNOSIS — E876 Hypokalemia: Secondary | ICD-10-CM | POA: Diagnosis present

## 2023-05-15 DIAGNOSIS — G4700 Insomnia, unspecified: Secondary | ICD-10-CM | POA: Diagnosis present

## 2023-05-15 DIAGNOSIS — S22019A Unspecified fracture of first thoracic vertebra, initial encounter for closed fracture: Secondary | ICD-10-CM | POA: Diagnosis present

## 2023-05-15 DIAGNOSIS — Z818 Family history of other mental and behavioral disorders: Secondary | ICD-10-CM

## 2023-05-15 DIAGNOSIS — S12400A Unspecified displaced fracture of fifth cervical vertebra, initial encounter for closed fracture: Secondary | ICD-10-CM | POA: Diagnosis not present

## 2023-05-15 DIAGNOSIS — Z87891 Personal history of nicotine dependence: Secondary | ICD-10-CM

## 2023-05-15 DIAGNOSIS — Z9151 Personal history of suicidal behavior: Secondary | ICD-10-CM

## 2023-05-15 DIAGNOSIS — S41112A Laceration without foreign body of left upper arm, initial encounter: Secondary | ICD-10-CM | POA: Diagnosis present

## 2023-05-15 DIAGNOSIS — S36114A Minor laceration of liver, initial encounter: Secondary | ICD-10-CM | POA: Diagnosis present

## 2023-05-15 DIAGNOSIS — S129XXA Fracture of neck, unspecified, initial encounter: Secondary | ICD-10-CM

## 2023-05-15 DIAGNOSIS — F10929 Alcohol use, unspecified with intoxication, unspecified: Secondary | ICD-10-CM

## 2023-05-15 DIAGNOSIS — S42101A Fracture of unspecified part of scapula, right shoulder, initial encounter for closed fracture: Secondary | ICD-10-CM | POA: Diagnosis present

## 2023-05-15 DIAGNOSIS — Z7141 Alcohol abuse counseling and surveillance of alcoholic: Secondary | ICD-10-CM

## 2023-05-15 DIAGNOSIS — Y907 Blood alcohol level of 200-239 mg/100 ml: Secondary | ICD-10-CM | POA: Diagnosis present

## 2023-05-15 DIAGNOSIS — S01511A Laceration without foreign body of lip, initial encounter: Secondary | ICD-10-CM | POA: Diagnosis present

## 2023-05-15 DIAGNOSIS — S12600A Unspecified displaced fracture of seventh cervical vertebra, initial encounter for closed fracture: Secondary | ICD-10-CM | POA: Diagnosis present

## 2023-05-15 DIAGNOSIS — F10229 Alcohol dependence with intoxication, unspecified: Secondary | ICD-10-CM | POA: Diagnosis present

## 2023-05-15 DIAGNOSIS — S41111A Laceration without foreign body of right upper arm, initial encounter: Secondary | ICD-10-CM | POA: Diagnosis present

## 2023-05-15 DIAGNOSIS — J9601 Acute respiratory failure with hypoxia: Secondary | ICD-10-CM | POA: Diagnosis present

## 2023-05-15 DIAGNOSIS — Z23 Encounter for immunization: Secondary | ICD-10-CM

## 2023-05-15 DIAGNOSIS — F419 Anxiety disorder, unspecified: Secondary | ICD-10-CM | POA: Diagnosis present

## 2023-05-15 DIAGNOSIS — F329 Major depressive disorder, single episode, unspecified: Secondary | ICD-10-CM | POA: Diagnosis present

## 2023-05-15 DIAGNOSIS — N179 Acute kidney failure, unspecified: Secondary | ICD-10-CM | POA: Diagnosis present

## 2023-05-15 LAB — COMPREHENSIVE METABOLIC PANEL
ALT: 425 U/L — ABNORMAL HIGH (ref 0–44)
AST: 720 U/L — ABNORMAL HIGH (ref 15–41)
Albumin: 3.7 g/dL (ref 3.5–5.0)
Alkaline Phosphatase: 60 U/L (ref 38–126)
Anion gap: 14 (ref 5–15)
BUN: 12 mg/dL (ref 6–20)
CO2: 17 mmol/L — ABNORMAL LOW (ref 22–32)
Calcium: 8.2 mg/dL — ABNORMAL LOW (ref 8.9–10.3)
Chloride: 108 mmol/L (ref 98–111)
Creatinine, Ser: 1.07 mg/dL — ABNORMAL HIGH (ref 0.44–1.00)
GFR, Estimated: >60 mL/min (ref >60)
Glucose, Bld: 149 mg/dL — ABNORMAL HIGH (ref 70–99)
Potassium: 2.7 mmol/L — CL (ref 3.5–5.1)
Sodium: 139 mmol/L (ref 135–145)
Total Bilirubin: 0.5 mg/dL (ref 0.3–1.2)
Total Protein: 6.6 g/dL (ref 6.5–8.1)

## 2023-05-15 LAB — URINALYSIS, ROUTINE W REFLEX MICROSCOPIC
Bilirubin Urine: NEGATIVE
Glucose, UA: NEGATIVE mg/dL
Ketones, ur: NEGATIVE mg/dL
Leukocytes,Ua: NEGATIVE
Nitrite: NEGATIVE
Protein, ur: 30 mg/dL — AB
Specific Gravity, Urine: 1.028 (ref 1.005–1.030)
pH: 6 (ref 5.0–8.0)

## 2023-05-15 LAB — PROTIME-INR
INR: 1.3 — ABNORMAL HIGH (ref 0.8–1.2)
Prothrombin Time: 16.1 seconds — ABNORMAL HIGH (ref 11.4–15.2)

## 2023-05-15 LAB — I-STAT CHEM 8, ED
BUN: 14 mg/dL (ref 6–20)
Calcium, Ion: 1.08 mmol/L — ABNORMAL LOW (ref 1.15–1.40)
Chloride: 104 mmol/L (ref 98–111)
Creatinine, Ser: 1.5 mg/dL — ABNORMAL HIGH (ref 0.44–1.00)
Glucose, Bld: 159 mg/dL — ABNORMAL HIGH (ref 70–99)
HCT: 41 % (ref 36.0–46.0)
Hemoglobin: 13.9 g/dL (ref 12.0–15.0)
Potassium: 2.9 mmol/L — ABNORMAL LOW (ref 3.5–5.1)
Sodium: 140 mmol/L (ref 135–145)
TCO2: 20 mmol/L — ABNORMAL LOW (ref 22–32)

## 2023-05-15 LAB — CBC
HCT: 40.1 % (ref 36.0–46.0)
Hemoglobin: 12.9 g/dL (ref 12.0–15.0)
MCH: 29.3 pg (ref 26.0–34.0)
MCHC: 32.2 g/dL (ref 30.0–36.0)
MCV: 91.1 fL (ref 80.0–100.0)
Platelets: 436 10*3/uL — ABNORMAL HIGH (ref 150–400)
RBC: 4.4 MIL/uL (ref 3.87–5.11)
RDW: 12.9 % (ref 11.5–15.5)
WBC: 28.6 10*3/uL — ABNORMAL HIGH (ref 4.0–10.5)
nRBC: 0 % (ref 0.0–0.2)

## 2023-05-15 LAB — SAMPLE TO BLOOD BANK

## 2023-05-15 LAB — LACTIC ACID, PLASMA: Lactic Acid, Venous: 4.9 mmol/L (ref 0.5–1.9)

## 2023-05-15 LAB — ETHANOL: Alcohol, Ethyl (B): 223 mg/dL — ABNORMAL HIGH (ref <10)

## 2023-05-15 MED ORDER — ROCURONIUM BROMIDE 50 MG/5ML IV SOLN
INTRAVENOUS | Status: AC | PRN
  Administered 2023-05-15: 100 mg via INTRAVENOUS

## 2023-05-15 MED ORDER — IOHEXOL 350 MG/ML SOLN
75.0000 mL | Freq: Once | INTRAVENOUS | Status: AC | PRN
  Administered 2023-05-15: 75 mL via INTRAVENOUS

## 2023-05-15 MED ORDER — FENTANYL BOLUS VIA INFUSION: 50.0000 ug | INTRAVENOUS | Status: DC | PRN

## 2023-05-15 MED ORDER — KETAMINE HCL 50 MG/ML IJ SOLN
240.0000 mg | Freq: Once | INTRAMUSCULAR | Status: DC
  Filled 2023-05-15: qty 10

## 2023-05-15 MED ORDER — SODIUM CHLORIDE 0.9 % IV SOLN
INTRAVENOUS | Status: AC | PRN
  Administered 2023-05-15: 999 mL/h via INTRAVENOUS

## 2023-05-15 MED ORDER — PROPOFOL 1000 MG/100ML IV EMUL
INTRAVENOUS | Status: AC
  Filled 2023-05-15: qty 100

## 2023-05-15 MED ORDER — PROPOFOL 1000 MG/100ML IV EMUL
0.0000 ug/kg/min | INTRAVENOUS | Status: DC
  Administered 2023-05-15: 10 ug/kg/min via INTRAVENOUS

## 2023-05-15 MED ORDER — ETOMIDATE 2 MG/ML IV SOLN
INTRAVENOUS | Status: AC | PRN
  Administered 2023-05-15: 20 mg via INTRAVENOUS

## 2023-05-15 MED ORDER — TETANUS-DIPHTH-ACELL PERTUSSIS 5-2.5-18.5 LF-MCG/0.5 IM SUSY
0.5000 mL | PREFILLED_SYRINGE | Freq: Once | INTRAMUSCULAR | Status: AC
  Administered 2023-05-15: 0.5 mL via INTRAMUSCULAR
  Filled 2023-05-15: qty 0.5

## 2023-05-15 MED ORDER — FENTANYL 2500MCG IN NS 250ML (10MCG/ML) PREMIX INFUSION
50.0000 ug/h | INTRAVENOUS | Status: DC
  Administered 2023-05-15: 50 ug/h via INTRAVENOUS
  Filled 2023-05-15: qty 250

## 2023-05-15 NOTE — Progress Notes (Signed)
ETT pulled out 1cm to 24 @ the lip per verbal order from MD.
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
## 2023-05-15 NOTE — Progress Notes (Signed)
Orthopedic Tech Progress Note Patient Details:  Sylvia Garcia 1986-09-20 [id number]  Patient ID: Chong Sicilian, female   DOB: [birth year], 37 y.o.   MRN: [id number] I attended trauma page. Trinna Post 05/15/2023, 10:50 PM
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
## 2023-05-15 NOTE — Procedures (Signed)
Procedure Name: Intubation Date/Time: 05/15/2023 11:53 PM  Performed by: Fayrene Helper, MDPre-anesthesia Checklist: Patient identified, Emergency Drugs available, Suction available, Patient being monitored and Timeout performed Induction Type: IV induction and Rapid sequence Laryngoscope Size: Glidescope, Mac and 4 Grade View: Grade I Tube size: 7.5 mm Number of attempts: 1 Airway Equipment and Method: Rigid stylet and Video-laryngoscopy Placement Confirmation: ETT inserted through vocal cords under direct vision, Positive ETCO2, CO2 detector and Breath sounds checked- equal and bilateral Secured at: 24 cm Tube secured with: ETT holder Dental Injury: Teeth and Oropharynx as per pre-operative assessment  Comments: Patient tolerated procedure well without any immediate complications.
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
## 2023-05-15 NOTE — ED Provider Notes (Deleted)
Procedure Name: Intubation Date/Time: 05/15/2023 11:37 PM  Performed by: Fayrene Helper, MDPre-anesthesia Checklist: Emergency Drugs available, Suction available, Patient being monitored, Timeout performed and Patient identified Induction Type: Rapid sequence Laryngoscope Size: Glidescope, Mac and 4 Grade View: Grade I Tube size: 7.5 mm Number of attempts: 1 Airway Equipment and Method: Rigid stylet and Video-laryngoscopy Placement Confirmation: ETT inserted through vocal cords under direct vision, Positive ETCO2, CO2 detector and Breath sounds checked- equal and bilateral Secured at: 24 cm Tube secured with: ETT holder Dental Injury: Teeth and Oropharynx as per pre-operative assessment  Comments: Patient tolerated procedure without complication; tube placement confirmed via CXR.         Fayrene Helper, MD 05/15/23 [phone]

## 2023-05-15 NOTE — Progress Notes (Signed)
Pt transported from ED to CT and back to room X2 on the vent w/o any complications.

## 2023-05-15 NOTE — ED Notes (Signed)
Pt transported to CT on portable monitor with RN, MD, RT.

## 2023-05-15 NOTE — ED Notes (Signed)
Patient transported to CT with TRN, and RRT.
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
## 2023-05-15 NOTE — ED Notes (Signed)
Please call sister, Wyn Forster, [phone] and mom, Stanton Kidney [phone]

## 2023-05-16 ENCOUNTER — Inpatient Hospital Stay (HOSPITAL_COMMUNITY): Payer: BLUE CROSS/BLUE SHIELD

## 2023-05-16 DIAGNOSIS — E739 Lactose intolerance, unspecified: Secondary | ICD-10-CM | POA: Diagnosis present

## 2023-05-16 DIAGNOSIS — F329 Major depressive disorder, single episode, unspecified: Secondary | ICD-10-CM | POA: Diagnosis present

## 2023-05-16 DIAGNOSIS — S41111A Laceration without foreign body of right upper arm, initial encounter: Secondary | ICD-10-CM | POA: Diagnosis not present

## 2023-05-16 DIAGNOSIS — Y907 Blood alcohol level of 200-239 mg/100 ml: Secondary | ICD-10-CM | POA: Diagnosis present

## 2023-05-16 DIAGNOSIS — F4311 Post-traumatic stress disorder, acute: Secondary | ICD-10-CM | POA: Diagnosis not present

## 2023-05-16 DIAGNOSIS — G4700 Insomnia, unspecified: Secondary | ICD-10-CM | POA: Diagnosis present

## 2023-05-16 DIAGNOSIS — S01511A Laceration without foreign body of lip, initial encounter: Secondary | ICD-10-CM | POA: Diagnosis not present

## 2023-05-16 DIAGNOSIS — Y92410 Unspecified street and highway as the place of occurrence of the external cause: Secondary | ICD-10-CM | POA: Diagnosis not present

## 2023-05-16 DIAGNOSIS — Z7141 Alcohol abuse counseling and surveillance of alcoholic: Secondary | ICD-10-CM | POA: Diagnosis not present

## 2023-05-16 DIAGNOSIS — J9601 Acute respiratory failure with hypoxia: Secondary | ICD-10-CM | POA: Diagnosis not present

## 2023-05-16 DIAGNOSIS — S2243XA Multiple fractures of ribs, bilateral, initial encounter for closed fracture: Secondary | ICD-10-CM | POA: Diagnosis not present

## 2023-05-16 DIAGNOSIS — S42101A Fracture of unspecified part of scapula, right shoulder, initial encounter for closed fracture: Secondary | ICD-10-CM | POA: Diagnosis not present

## 2023-05-16 DIAGNOSIS — S22019A Unspecified fracture of first thoracic vertebra, initial encounter for closed fracture: Secondary | ICD-10-CM | POA: Diagnosis not present

## 2023-05-16 DIAGNOSIS — Z23 Encounter for immunization: Secondary | ICD-10-CM | POA: Diagnosis not present

## 2023-05-16 DIAGNOSIS — Z87891 Personal history of nicotine dependence: Secondary | ICD-10-CM | POA: Diagnosis not present

## 2023-05-16 DIAGNOSIS — E876 Hypokalemia: Secondary | ICD-10-CM | POA: Diagnosis present

## 2023-05-16 DIAGNOSIS — R000 Tachycardia, unspecified: Secondary | ICD-10-CM | POA: Diagnosis not present

## 2023-05-16 DIAGNOSIS — F10229 Alcohol dependence with intoxication, unspecified: Secondary | ICD-10-CM | POA: Diagnosis present

## 2023-05-16 DIAGNOSIS — S12600A Unspecified displaced fracture of seventh cervical vertebra, initial encounter for closed fracture: Secondary | ICD-10-CM | POA: Diagnosis not present

## 2023-05-16 DIAGNOSIS — S12400A Unspecified displaced fracture of fifth cervical vertebra, initial encounter for closed fracture: Secondary | ICD-10-CM | POA: Diagnosis not present

## 2023-05-16 DIAGNOSIS — S27322A Contusion of lung, bilateral, initial encounter: Secondary | ICD-10-CM | POA: Diagnosis not present

## 2023-05-16 DIAGNOSIS — N179 Acute kidney failure, unspecified: Secondary | ICD-10-CM | POA: Diagnosis present

## 2023-05-16 DIAGNOSIS — S36114A Minor laceration of liver, initial encounter: Secondary | ICD-10-CM | POA: Diagnosis not present

## 2023-05-16 DIAGNOSIS — S22059A Unspecified fracture of T5-T6 vertebra, initial encounter for closed fracture: Secondary | ICD-10-CM | POA: Diagnosis not present

## 2023-05-16 DIAGNOSIS — S41112A Laceration without foreign body of left upper arm, initial encounter: Secondary | ICD-10-CM | POA: Diagnosis present

## 2023-05-16 DIAGNOSIS — F419 Anxiety disorder, unspecified: Secondary | ICD-10-CM | POA: Diagnosis present

## 2023-05-16 LAB — I-STAT ARTERIAL BLOOD GAS, ED
Acid-base deficit: 10 mmol/L — ABNORMAL HIGH (ref 0.0–2.0)
Bicarbonate: 16.6 mmol/L — ABNORMAL LOW (ref 20.0–28.0)
Calcium, Ion: 1.15 mmol/L (ref 1.15–1.40)
HCT: 35 % — ABNORMAL LOW (ref 36.0–46.0)
Hemoglobin: 11.9 g/dL — ABNORMAL LOW (ref 12.0–15.0)
O2 Saturation: 100 %
Patient temperature: 98
Potassium: 2.4 mmol/L — CL (ref 3.5–5.1)
Sodium: 138 mmol/L (ref 135–145)
TCO2: 18 mmol/L — ABNORMAL LOW (ref 22–32)
pCO2 arterial: 36.8 mmHg (ref 32–48)
pH, Arterial: 7.261 — ABNORMAL LOW (ref 7.35–7.45)
pO2, Arterial: 565 mmHg — ABNORMAL HIGH (ref 83–108)

## 2023-05-16 LAB — MRSA NEXT GEN BY PCR, NASAL: MRSA by PCR Next Gen: NOT DETECTED

## 2023-05-16 LAB — BASIC METABOLIC PANEL
Anion gap: 15 (ref 5–15)
BUN: 8 mg/dL (ref 6–20)
CO2: 17 mmol/L — ABNORMAL LOW (ref 22–32)
Calcium: 8.4 mg/dL — ABNORMAL LOW (ref 8.9–10.3)
Chloride: 104 mmol/L (ref 98–111)
Creatinine, Ser: 0.8 mg/dL (ref 0.44–1.00)
GFR, Estimated: >60 mL/min (ref >60)
Glucose, Bld: 83 mg/dL (ref 70–99)
Potassium: 3.8 mmol/L (ref 3.5–5.1)
Sodium: 136 mmol/L (ref 135–145)

## 2023-05-16 LAB — TRIGLYCERIDES: Triglycerides: 179 mg/dL — ABNORMAL HIGH (ref <150)

## 2023-05-16 LAB — HEPATIC FUNCTION PANEL
ALT: 431 U/L — ABNORMAL HIGH (ref 0–44)
AST: 670 U/L — ABNORMAL HIGH (ref 15–41)
Albumin: 3.7 g/dL (ref 3.5–5.0)
Alkaline Phosphatase: 52 U/L (ref 38–126)
Bilirubin, Direct: <0.1 mg/dL (ref 0.0–0.2)
Total Bilirubin: 0.5 mg/dL (ref 0.3–1.2)
Total Protein: 6.7 g/dL (ref 6.5–8.1)

## 2023-05-16 LAB — CBC
HCT: 38.2 % (ref 36.0–46.0)
Hemoglobin: 12.6 g/dL (ref 12.0–15.0)
MCH: 28.9 pg (ref 26.0–34.0)
MCHC: 33 g/dL (ref 30.0–36.0)
MCV: 87.6 fL (ref 80.0–100.0)
Platelets: 437 10*3/uL — ABNORMAL HIGH (ref 150–400)
RBC: 4.36 MIL/uL (ref 3.87–5.11)
RDW: 13.1 % (ref 11.5–15.5)
WBC: 13.6 10*3/uL — ABNORMAL HIGH (ref 4.0–10.5)
nRBC: 0 % (ref 0.0–0.2)

## 2023-05-16 LAB — HIV ANTIBODY (ROUTINE TESTING W REFLEX): HIV Screen 4th Generation wRfx: NONREACTIVE

## 2023-05-16 MED ORDER — SODIUM CHLORIDE 0.9 % IV SOLN: INTRAVENOUS | Status: DC

## 2023-05-16 MED ORDER — PHENOBARBITAL 32.4 MG PO TABS: 32.4000 mg | ORAL_TABLET | Freq: Three times a day (TID) | ORAL | Status: DC

## 2023-05-16 MED ORDER — FENTANYL BOLUS VIA INFUSION: 50.0000 ug | INTRAVENOUS | Status: DC | PRN

## 2023-05-16 MED ORDER — PHENOBARBITAL 32.4 MG PO TABS
64.8000 mg | ORAL_TABLET | Freq: Three times a day (TID) | ORAL | Status: DC
  Administered 2023-05-16 – 2023-05-17 (×4): 64.8 mg
  Filled 2023-05-16 (×4): qty 2

## 2023-05-16 MED ORDER — ROCURONIUM BROMIDE 10 MG/ML (PF) SYRINGE
PREFILLED_SYRINGE | INTRAVENOUS | Status: AC
  Filled 2023-05-16: qty 10

## 2023-05-16 MED ORDER — ALBUMIN HUMAN 5 % IV SOLN
12.5000 g | Freq: Once | INTRAVENOUS | Status: AC
  Administered 2023-05-16: 12.5 g via INTRAVENOUS
  Filled 2023-05-16: qty 250

## 2023-05-16 MED ORDER — CLONAZEPAM 0.5 MG PO TABS
0.5000 mg | ORAL_TABLET | Freq: Two times a day (BID) | ORAL | Status: DC
  Administered 2023-05-16 – 2023-05-17 (×3): 0.5 mg
  Filled 2023-05-16 (×3): qty 1

## 2023-05-16 MED ORDER — PROPOFOL 1000 MG/100ML IV EMUL
0.0000 ug/kg/min | INTRAVENOUS | Status: DC
  Administered 2023-05-16: 30 ug/kg/min via INTRAVENOUS
  Administered 2023-05-16: 35 ug/kg/min via INTRAVENOUS
  Administered 2023-05-16 – 2023-05-17 (×2): 30 ug/kg/min via INTRAVENOUS
  Filled 2023-05-16 (×4): qty 100

## 2023-05-16 MED ORDER — FENTANYL 2500MCG IN NS 250ML (10MCG/ML) PREMIX INFUSION
50.0000 ug/h | INTRAVENOUS | Status: DC
  Administered 2023-05-16: 100 ug/h via INTRAVENOUS
  Filled 2023-05-16: qty 250

## 2023-05-16 MED ORDER — POTASSIUM CHLORIDE 10 MEQ/100ML IV SOLN
10.0000 meq | INTRAVENOUS | Status: AC
  Administered 2023-05-16 (×2): 10 meq via INTRAVENOUS
  Filled 2023-05-16 (×2): qty 100

## 2023-05-16 MED ORDER — CHLORHEXIDINE GLUCONATE CLOTH 2 % EX PADS
6.0000 | MEDICATED_PAD | Freq: Every day | CUTANEOUS | Status: DC
  Administered 2023-05-16 – 2023-05-23 (×7): 6 via TOPICAL

## 2023-05-16 MED ORDER — ONDANSETRON 4 MG PO TBDP: 4.0000 mg | ORAL_TABLET | Freq: Four times a day (QID) | ORAL | Status: DC | PRN

## 2023-05-16 MED ORDER — POTASSIUM CHLORIDE IN NACL 20-0.9 MEQ/L-% IV SOLN
INTRAVENOUS | Status: DC
  Filled 2023-05-16: qty 1000

## 2023-05-16 MED ORDER — ONDANSETRON HCL 4 MG/2ML IJ SOLN: 4.0000 mg | Freq: Four times a day (QID) | INTRAMUSCULAR | Status: DC | PRN

## 2023-05-16 MED ORDER — DOCUSATE SODIUM 50 MG/5ML PO LIQD
100.0000 mg | Freq: Two times a day (BID) | ORAL | Status: DC
  Administered 2023-05-16 – 2023-05-17 (×3): 100 mg
  Filled 2023-05-16 (×3): qty 10

## 2023-05-16 MED ORDER — LORAZEPAM 1 MG PO TABS
1.0000 mg | ORAL_TABLET | ORAL | Status: DC | PRN
  Filled 2023-05-16: qty 4

## 2023-05-16 MED ORDER — ORAL CARE MOUTH RINSE: 15.0000 mL | OROMUCOSAL | Status: DC | PRN

## 2023-05-16 MED ORDER — QUETIAPINE FUMARATE 25 MG PO TABS
50.0000 mg | ORAL_TABLET | Freq: Two times a day (BID) | ORAL | Status: DC
  Administered 2023-05-16 – 2023-05-17 (×3): 50 mg
  Filled 2023-05-16 (×3): qty 2

## 2023-05-16 MED ORDER — HYDRALAZINE HCL 20 MG/ML IJ SOLN: 10.0000 mg | INTRAMUSCULAR | Status: DC | PRN

## 2023-05-16 MED ORDER — ORAL CARE MOUTH RINSE
15.0000 mL | OROMUCOSAL | Status: DC
  Administered 2023-05-16 – 2023-05-17 (×19): 15 mL via OROMUCOSAL

## 2023-05-16 MED ORDER — ENOXAPARIN SODIUM 30 MG/0.3ML IJ SOSY
30.0000 mg | PREFILLED_SYRINGE | Freq: Two times a day (BID) | INTRAMUSCULAR | Status: DC
  Administered 2023-05-18 – 2023-05-23 (×11): 30 mg via SUBCUTANEOUS
  Filled 2023-05-16 (×12): qty 0.3

## 2023-05-16 MED ORDER — ADULT MULTIVITAMIN W/MINERALS CH
1.0000 | ORAL_TABLET | Freq: Every day | ORAL | Status: DC
  Administered 2023-05-16 – 2023-05-17 (×2): 1
  Filled 2023-05-16 (×2): qty 1

## 2023-05-16 MED ORDER — ACETAMINOPHEN 500 MG PO TABS
1000.0000 mg | ORAL_TABLET | Freq: Four times a day (QID) | ORAL | Status: DC
  Administered 2023-05-16 – 2023-05-17 (×6): 1000 mg
  Filled 2023-05-16 (×6): qty 2

## 2023-05-16 MED ORDER — ROCURONIUM BROMIDE 50 MG/5ML IV SOLN
INTRAVENOUS | Status: DC | PRN
  Administered 2023-05-16: 100 mg via INTRAVENOUS

## 2023-05-16 MED ORDER — POTASSIUM CHLORIDE IN NACL 20-0.9 MEQ/L-% IV SOLN
INTRAVENOUS | Status: DC
  Filled 2023-05-16 (×6): qty 1000

## 2023-05-16 MED ORDER — POLYETHYLENE GLYCOL 3350 17 G PO PACK: 17.0000 g | PACK | Freq: Every day | ORAL | Status: DC | PRN

## 2023-05-16 MED ORDER — METOPROLOL TARTRATE 5 MG/5ML IV SOLN: 5.0000 mg | Freq: Four times a day (QID) | INTRAVENOUS | Status: DC | PRN

## 2023-05-16 MED ORDER — POLYETHYLENE GLYCOL 3350 17 G PO PACK: 17.0000 g | PACK | Freq: Every day | ORAL | Status: DC

## 2023-05-16 MED ORDER — PROPOFOL 10 MG/ML IV BOLUS
INTRAVENOUS | Status: DC | PRN
  Administered 2023-05-16: 70 ug via INTRAVENOUS

## 2023-05-16 MED ORDER — THIAMINE HCL 100 MG/ML IJ SOLN
100.0000 mg | Freq: Every day | INTRAMUSCULAR | Status: DC
  Filled 2023-05-16: qty 2

## 2023-05-16 MED ORDER — THIAMINE MONONITRATE 100 MG PO TABS
100.0000 mg | ORAL_TABLET | Freq: Every day | ORAL | Status: DC
  Administered 2023-05-16 – 2023-05-17 (×2): 100 mg
  Filled 2023-05-16 (×2): qty 1

## 2023-05-16 MED ORDER — DOCUSATE SODIUM 100 MG PO CAPS
100.0000 mg | ORAL_CAPSULE | Freq: Two times a day (BID) | ORAL | Status: DC
Start: 2023-05-16 — End: 2023-05-16

## 2023-05-16 MED ORDER — FOLIC ACID 1 MG PO TABS
1.0000 mg | ORAL_TABLET | Freq: Every day | ORAL | Status: DC
  Administered 2023-05-16 – 2023-05-17 (×2): 1 mg
  Filled 2023-05-16 (×2): qty 1

## 2023-05-16 MED ORDER — FENTANYL CITRATE PF 50 MCG/ML IJ SOSY
50.0000 ug | PREFILLED_SYRINGE | Freq: Once | INTRAMUSCULAR | Status: DC
  Filled 2023-05-16: qty 1
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
## 2023-05-16 NOTE — Progress Notes (Signed)
RT called for pt self extubated. Pt reintubated by ED MD w/ 7.5 ETT at 24 @ the lip. Pt placed back on vent and Pt transported to 4N on vent w/o any complications.
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
## 2023-05-16 NOTE — TOC CAGE-AID Note (Signed)
Transition of Care [facility]) - CAGE-AID Screening   Patient Details  Name: Zaia Vala MRN: [id number] Date of Birth: 01-25-1986  Transition of Care [facility]) CM/SW Contact:    Leota Sauers, RN Phone Number: 05/16/2023, 12:37 AM   Clinical Narrative:  Patient currently intubated and unable to participate in screening.  CAGE-AID Screening: Substance Abuse Screening unable to be completed due to: : Patient unable to participate
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
## 2023-05-17 ENCOUNTER — Inpatient Hospital Stay (HOSPITAL_COMMUNITY): Payer: BLUE CROSS/BLUE SHIELD

## 2023-05-17 LAB — BASIC METABOLIC PANEL
Anion gap: 13 (ref 5–15)
BUN: 11 mg/dL (ref 6–20)
CO2: 20 mmol/L — ABNORMAL LOW (ref 22–32)
Calcium: 8.2 mg/dL — ABNORMAL LOW (ref 8.9–10.3)
Chloride: 105 mmol/L (ref 98–111)
Creatinine, Ser: 0.87 mg/dL (ref 0.44–1.00)
GFR, Estimated: >60 mL/min (ref >60)
Glucose, Bld: 76 mg/dL (ref 70–99)
Potassium: 3.9 mmol/L (ref 3.5–5.1)
Sodium: 138 mmol/L (ref 135–145)

## 2023-05-17 LAB — CBC
HCT: 30.2 % — ABNORMAL LOW (ref 36.0–46.0)
Hemoglobin: 10 g/dL — ABNORMAL LOW (ref 12.0–15.0)
MCH: 29.4 pg (ref 26.0–34.0)
MCHC: 33.1 g/dL (ref 30.0–36.0)
MCV: 88.8 fL (ref 80.0–100.0)
Platelets: 281 10*3/uL (ref 150–400)
RBC: 3.4 MIL/uL — ABNORMAL LOW (ref 3.87–5.11)
RDW: 13.3 % (ref 11.5–15.5)
WBC: 10 10*3/uL (ref 4.0–10.5)
nRBC: 0 % (ref 0.0–0.2)

## 2023-05-17 LAB — TRIGLYCERIDES: Triglycerides: 80 mg/dL (ref <150)

## 2023-05-17 MED ORDER — DOCUSATE SODIUM 50 MG/5ML PO LIQD
100.0000 mg | Freq: Two times a day (BID) | ORAL | Status: DC
  Administered 2023-05-18: 100 mg via ORAL
  Filled 2023-05-17 (×4): qty 10

## 2023-05-17 MED ORDER — QUETIAPINE FUMARATE 100 MG PO TABS: 100.0000 mg | ORAL_TABLET | Freq: Two times a day (BID) | ORAL | Status: DC

## 2023-05-17 MED ORDER — OXYCODONE HCL 5 MG PO TABS
5.0000 mg | ORAL_TABLET | ORAL | Status: DC | PRN
  Administered 2023-05-17 (×2): 10 mg
  Filled 2023-05-17 (×2): qty 2

## 2023-05-17 MED ORDER — QUETIAPINE FUMARATE 25 MG PO TABS
50.0000 mg | ORAL_TABLET | Freq: Once | ORAL | Status: AC
  Administered 2023-05-17: 50 mg
  Filled 2023-05-17: qty 2

## 2023-05-17 MED ORDER — ADULT MULTIVITAMIN W/MINERALS CH
1.0000 | ORAL_TABLET | Freq: Every day | ORAL | Status: DC
  Administered 2023-05-19 – 2023-05-23 (×5): 1 via ORAL
  Filled 2023-05-17 (×8): qty 1

## 2023-05-17 MED ORDER — OXYCODONE HCL 5 MG PO TABS
5.0000 mg | ORAL_TABLET | ORAL | Status: DC | PRN
  Administered 2023-05-17 – 2023-05-19 (×6): 10 mg via ORAL
  Filled 2023-05-17 (×9): qty 2

## 2023-05-17 MED ORDER — DEXMEDETOMIDINE HCL IN NACL 400 MCG/100ML IV SOLN
0.0000 ug/kg/h | INTRAVENOUS | Status: DC
  Administered 2023-05-17: 0.4 ug/kg/h via INTRAVENOUS
  Administered 2023-05-18: 0.2 ug/kg/h via INTRAVENOUS
  Filled 2023-05-17 (×2): qty 100

## 2023-05-17 MED ORDER — METHOCARBAMOL 500 MG PO TABS
1000.0000 mg | ORAL_TABLET | Freq: Three times a day (TID) | ORAL | Status: DC
  Administered 2023-05-17 – 2023-05-23 (×15): 1000 mg via ORAL
  Filled 2023-05-17 (×17): qty 2

## 2023-05-17 MED ORDER — ACETAMINOPHEN 500 MG PO TABS
1000.0000 mg | ORAL_TABLET | Freq: Four times a day (QID) | ORAL | Status: DC
  Administered 2023-05-18 – 2023-05-23 (×19): 1000 mg via ORAL
  Filled 2023-05-17 (×22): qty 2

## 2023-05-17 MED ORDER — ORAL CARE MOUTH RINSE: 15.0000 mL | OROMUCOSAL | Status: DC | PRN

## 2023-05-17 MED ORDER — FOLIC ACID 1 MG PO TABS
1.0000 mg | ORAL_TABLET | Freq: Every day | ORAL | Status: DC
  Administered 2023-05-19 – 2023-05-23 (×5): 1 mg via ORAL
  Filled 2023-05-17 (×7): qty 1

## 2023-05-17 MED ORDER — QUETIAPINE FUMARATE 100 MG PO TABS
100.0000 mg | ORAL_TABLET | Freq: Two times a day (BID) | ORAL | Status: DC
  Administered 2023-05-17 – 2023-05-21 (×8): 100 mg via ORAL
  Filled 2023-05-17 (×9): qty 1

## 2023-05-17 MED ORDER — CLONAZEPAM 0.5 MG PO TABS
0.5000 mg | ORAL_TABLET | Freq: Two times a day (BID) | ORAL | Status: DC
  Administered 2023-05-17 – 2023-05-23 (×12): 0.5 mg via ORAL
  Filled 2023-05-17 (×13): qty 1

## 2023-05-17 MED ORDER — MORPHINE SULFATE (PF) 2 MG/ML IV SOLN
2.0000 mg | INTRAVENOUS | Status: DC | PRN
  Administered 2023-05-18: 2 mg via INTRAVENOUS
  Administered 2023-05-18: 4 mg via INTRAVENOUS
  Administered 2023-05-18 – 2023-05-21 (×5): 2 mg via INTRAVENOUS
  Administered 2023-05-21: 4 mg via INTRAVENOUS
  Administered 2023-05-21: 2 mg via INTRAVENOUS
  Administered 2023-05-22: 4 mg via INTRAVENOUS
  Filled 2023-05-17: qty 2
  Filled 2023-05-17 (×3): qty 1
  Filled 2023-05-17 (×3): qty 2
  Filled 2023-05-17 (×3): qty 1
  Filled 2023-05-17: qty 2
  Filled 2023-05-17: qty 1

## 2023-05-17 MED ORDER — PHENOBARBITAL 32.4 MG PO TABS: 32.4000 mg | ORAL_TABLET | Freq: Three times a day (TID) | ORAL | Status: DC

## 2023-05-17 MED ORDER — METHOCARBAMOL 500 MG PO TABS
1000.0000 mg | ORAL_TABLET | Freq: Three times a day (TID) | ORAL | Status: DC
  Administered 2023-05-17: 1000 mg
  Filled 2023-05-17: qty 2

## 2023-05-17 MED ORDER — PHENOBARBITAL 32.4 MG PO TABS
64.8000 mg | ORAL_TABLET | Freq: Three times a day (TID) | ORAL | Status: AC
  Administered 2023-05-17 – 2023-05-18 (×2): 64.8 mg via ORAL
  Filled 2023-05-17 (×2): qty 2
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
## 2023-05-18 LAB — BASIC METABOLIC PANEL
Anion gap: 12 (ref 5–15)
Anion gap: 17 — ABNORMAL HIGH (ref 5–15)
BUN: 5 mg/dL — ABNORMAL LOW (ref 6–20)
BUN: 5 mg/dL — ABNORMAL LOW (ref 6–20)
CO2: 17 mmol/L — ABNORMAL LOW (ref 22–32)
CO2: 17 mmol/L — ABNORMAL LOW (ref 22–32)
Calcium: 8.2 mg/dL — ABNORMAL LOW (ref 8.9–10.3)
Calcium: 8.5 mg/dL — ABNORMAL LOW (ref 8.9–10.3)
Chloride: 108 mmol/L (ref 98–111)
Chloride: 104 mmol/L (ref 98–111)
Creatinine, Ser: 0.87 mg/dL (ref 0.44–1.00)
Creatinine, Ser: 0.91 mg/dL (ref 0.44–1.00)
GFR, Estimated: >60 mL/min (ref >60)
GFR, Estimated: >60 mL/min (ref >60)
Glucose, Bld: 76 mg/dL (ref 70–99)
Glucose, Bld: 58 mg/dL — ABNORMAL LOW (ref 70–99)
Potassium: 3.7 mmol/L (ref 3.5–5.1)
Potassium: 4.2 mmol/L (ref 3.5–5.1)
Sodium: 137 mmol/L (ref 135–145)
Sodium: 138 mmol/L (ref 135–145)

## 2023-05-18 LAB — CBC
HCT: 29.8 % — ABNORMAL LOW (ref 36.0–46.0)
Hemoglobin: 10 g/dL — ABNORMAL LOW (ref 12.0–15.0)
MCH: 30.6 pg (ref 26.0–34.0)
MCHC: 33.6 g/dL (ref 30.0–36.0)
MCV: 91.1 fL (ref 80.0–100.0)
Platelets: 273 10*3/uL (ref 150–400)
RBC: 3.27 MIL/uL — ABNORMAL LOW (ref 3.87–5.11)
RDW: 13.2 % (ref 11.5–15.5)
WBC: 9.7 10*3/uL (ref 4.0–10.5)
nRBC: 0 % (ref 0.0–0.2)

## 2023-05-18 MED ORDER — POLYETHYLENE GLYCOL 3350 17 G PO PACK: 17.0000 g | PACK | Freq: Every day | ORAL | Status: DC | PRN

## 2023-05-18 MED ORDER — LORAZEPAM 1 MG PO TABS
1.0000 mg | ORAL_TABLET | ORAL | Status: AC | PRN
  Administered 2023-05-18: 4 mg via ORAL
  Administered 2023-05-18: 1 mg via ORAL
  Filled 2023-05-18: qty 4
  Filled 2023-05-18: qty 1
  Filled 2023-05-18: qty 4

## 2023-05-18 MED ORDER — PHENOBARBITAL 32.4 MG PO TABS
32.4000 mg | ORAL_TABLET | Freq: Three times a day (TID) | ORAL | Status: AC
  Administered 2023-05-18 – 2023-05-20 (×5): 32.4 mg via ORAL
  Filled 2023-05-18 (×6): qty 1

## 2023-05-18 MED ORDER — MELATONIN 3 MG PO TABS
3.0000 mg | ORAL_TABLET | Freq: Once | ORAL | Status: DC
  Filled 2023-05-18: qty 1

## 2023-05-18 MED ORDER — THIAMINE HCL 100 MG/ML IJ SOLN
100.0000 mg | Freq: Every day | INTRAMUSCULAR | Status: DC
  Filled 2023-05-18 (×2): qty 2

## 2023-05-18 MED ORDER — ENSURE ENLIVE PO LIQD
237.0000 mL | Freq: Three times a day (TID) | ORAL | Status: DC
  Administered 2023-05-18 – 2023-05-21 (×2): 237 mL via ORAL

## 2023-05-18 MED ORDER — THIAMINE MONONITRATE 100 MG PO TABS
100.0000 mg | ORAL_TABLET | Freq: Every day | ORAL | Status: DC
  Administered 2023-05-19 – 2023-05-23 (×5): 100 mg via ORAL
  Filled 2023-05-18 (×6): qty 1

## 2023-05-18 MED ORDER — CALCIUM GLUCONATE-NACL 2-0.675 GM/100ML-% IV SOLN
2.0000 g | Freq: Once | INTRAVENOUS | Status: AC
  Administered 2023-05-18: 2000 mg via INTRAVENOUS
  Filled 2023-05-18: qty 100

## 2023-05-18 NOTE — Plan of Care (Signed)
  Problem: Clinical Measurements: Goal: Ability to maintain clinical measurements within normal limits will improve Outcome: Progressing Goal: Will remain free from infection Outcome: Progressing Goal: Diagnostic test results will improve Outcome: Progressing Goal: Respiratory complications will improve Outcome: Progressing Goal: Cardiovascular complication will be avoided Outcome: Progressing   Problem: Activity: Goal: Risk for activity intolerance will decrease Outcome: Progressing   Problem: Elimination: Goal: Will not experience complications related to bowel motility Outcome: Progressing Goal: Will not experience complications related to urinary retention Outcome: Progressing   Problem: Pain Managment: Goal: General experience of comfort will improve Outcome: Progressing   Problem: Safety: Goal: Ability to remain free from injury will improve Outcome: Progressing   Problem: Skin Integrity: Goal: Risk for impaired skin integrity will decrease Outcome: Progressing   Problem: Education: Goal: Knowledge of General Education information will improve Description: Including pain rating scale, medication(s)/side effects and non-pharmacologic comfort measures Outcome: Not Progressing   Problem: Health Behavior/Discharge Planning: Goal: Ability to manage health-related needs will improve Outcome: Not Progressing   Problem: Nutrition: Goal: Adequate nutrition will be maintained Outcome: Not Progressing   Problem: Coping: Goal: Level of anxiety will decrease Outcome: Not Progressing
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
## 2023-05-18 NOTE — Progress Notes (Signed)
SLP Cancellation Note  Patient Details Name: Sylvia Garcia MRN: [id number] DOB: 1986/04/07   Cancelled treatment:       Reason Eval/Treat Not Completed: Patient's level of consciousness. Per RN, pt currently too lethargic to participate in cog/com evaluation. Will continue efforts.  Cristianna Cyr B. Murvin Natal, [facility], CCC-SLP Speech Language Pathologist Office: [phone]  Leigh Aurora 05/18/2023, 2:43 PM

## 2023-05-18 NOTE — Progress Notes (Signed)
Pt cardiac rhythm going in and out of ventricular bi/trigeminy every few minutes. Janee Morn MD notified. Per MD, stat BMET and EKG ordered. Kinsinger MD paged at [phone], notified of results available in chart.

## 2023-05-18 NOTE — Progress Notes (Addendum)
0900- During 0900 assessment, pt asked this RN for meds to help relax. Pt was taking off C-collar and highly agitated/ uncooperative. Due to pt history of refusing meds, this RN crushed all scheduled 1000 meds with PRN ativan 4 mg and 10 oxy in small bite of applesauce. Pt refused meds in applesauce. This RN explained that the requested meds for sleep were in the bite of applesauce, but pt grew increasingly agitated and requested pills whole. This RN disposed of crushed meds in Steri cycle and gave pt whole pills. Witnessed by Oralia Manis RN. Pt took the PRN meds but refused some of the scheduled pills. Pt yelling at this time, unable to give all scheduled meds.   1300- Pt asked for pain medicine but again would not cooperate to swallow pills. 10 oxy wasted in [employer], witnessed by Oralia Manis RN.

## 2023-05-18 NOTE — Progress Notes (Signed)
   05/18/23 2200  Urine Characteristics  Bladder Scan Volume (mL) 550 mL  Intermittent/Straight Cath (mL) 675 mL  Hygiene Peri care   Straight cath X 1 post foley.

## 2023-05-18 NOTE — Progress Notes (Signed)
During 0000 assessment, pain was assessed and patient was upset/agitated and stated "I'm in no pain and I don't want your meds". Provided education on medications and their importance in recovery. 0030- repeat pain assessment with patient nodding head to my question about being in pain. This RN gave 2 mg Morphine IV for patient's stated pain. (See MAR) 0330- Patient requests something for sleep. Upon ordering one time dose of Melatonin 3 mg, patient agitated and subsequently required more dexmedetomidine to maintain ordered RAS score. After drug titration, patient no longer needed Melatonin. Documented not given, see MAR.

## 2023-05-19 LAB — CBC
HCT: 35.2 % — ABNORMAL LOW (ref 36.0–46.0)
Hemoglobin: 11.9 g/dL — ABNORMAL LOW (ref 12.0–15.0)
MCH: 30.3 pg (ref 26.0–34.0)
MCHC: 33.8 g/dL (ref 30.0–36.0)
MCV: 89.6 fL (ref 80.0–100.0)
Platelets: 309 10*3/uL (ref 150–400)
RBC: 3.93 MIL/uL (ref 3.87–5.11)
RDW: 12.9 % (ref 11.5–15.5)
WBC: 8.6 10*3/uL (ref 4.0–10.5)
nRBC: 0 % (ref 0.0–0.2)

## 2023-05-19 LAB — BASIC METABOLIC PANEL
Anion gap: 11 (ref 5–15)
BUN: <5 mg/dL — ABNORMAL LOW (ref 6–20)
CO2: 20 mmol/L — ABNORMAL LOW (ref 22–32)
Calcium: 8.8 mg/dL — ABNORMAL LOW (ref 8.9–10.3)
Chloride: 105 mmol/L (ref 98–111)
Creatinine, Ser: 0.8 mg/dL (ref 0.44–1.00)
GFR, Estimated: >60 mL/min (ref >60)
Glucose, Bld: 84 mg/dL (ref 70–99)
Potassium: 4 mmol/L (ref 3.5–5.1)
Sodium: 136 mmol/L (ref 135–145)

## 2023-05-19 LAB — TRIGLYCERIDES: Triglycerides: 135 mg/dL (ref <150)

## 2023-05-19 MED ORDER — OXYCODONE HCL 5 MG PO TABS
10.0000 mg | ORAL_TABLET | ORAL | Status: DC | PRN
  Administered 2023-05-19 (×2): 15 mg via ORAL
  Administered 2023-05-19: 10 mg via ORAL
  Administered 2023-05-20 – 2023-05-21 (×5): 15 mg via ORAL
  Administered 2023-05-21: 10 mg via ORAL
  Administered 2023-05-21 – 2023-05-22 (×3): 15 mg via ORAL
  Administered 2023-05-23: 10 mg via ORAL
  Filled 2023-05-19: qty 2
  Filled 2023-05-19 (×5): qty 3
  Filled 2023-05-19: qty 2
  Filled 2023-05-19 (×2): qty 3
  Filled 2023-05-19: qty 2
  Filled 2023-05-19 (×4): qty 3

## 2023-05-19 MED ORDER — WHITE PETROLATUM EX OINT
TOPICAL_OINTMENT | CUTANEOUS | Status: DC | PRN
  Filled 2023-05-19: qty 28.35

## 2023-05-19 NOTE — Progress Notes (Signed)
Patient's IV pump was beeping and Jodelle Gross, RN entered the room to find the patient sitting on the ground. The patient was quickly assessed and no apparent injury was noted. Multiple nurses placed the patient back in the bed, bed placed in low and locked position, call light in reach, floor mats in place, and bed alarm turned on. Dr. Bedelia Person was notified and came to bedside to assess patient. No new orders at this time.   Harley Alto, RN

## 2023-05-19 NOTE — TOC Progression Note (Signed)
Transition of Care [facility]) - Progression Note    Patient Details  Name: Exer Gromley MRN: [id number] Date of Birth: 18-May-1986  [facility] CM/SW Contact  Mearl Latin, LCSW Phone Number: 05/19/2023, 11:34 AM  Clinical Narrative:    Letter placed at bedside for family.      Barriers to Discharge: Continued Medical Work up  Expected Discharge Plan and Services In-house Referral: Clinical Social Work     Living arrangements for the past 2 months: Single Family Home                                       Social Determinants of Health (SDOH) Interventions SDOH Screenings   Tobacco Use: Medium Risk (01/04/2023)   [facility]    Readmission Risk Interventions     No data to display
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
## 2023-05-19 NOTE — Progress Notes (Signed)
Nutrition Brief Note  Met with pt and her mother at bedside. Pt more alert today but continues to have poor PO intake. Mother reports pt ate a little bit rice and peas last night (no documented intake). She did not want any breakfast this morning. Discussed with RN in morning and again after lunch. Pt did not eat any lunch either. There is concern pt would not tolerate Cortrak tube placement. Discussed with MD via secure chat, plan is to allow pt more time to work on PO intake and assess for possible need for Cortrak tube early next week pending oral intake. See RD note from 05/18/23 for full follow-up assessment.  Continue current nutrition intervention: Ensure Enlive po BID, each supplement provides 350 kcal and 20 grams of protein.  If pt continues to have poor PO intake, consider placement of Cortrak tube for initiation of enteral nutrition: -Initiate Pivot 1.5 at 20 mL/hour and advance by 10 mL/hour every 8 hours to goal rate of 50 mL/hour (1200 mL daily volume) -Provides: 1800 kcal, 113 grams of protein, 900 mL H2O daily   Continue multivitamin with minerals daily, folic acid 1 mg daily, thiamine 100 mg daily.  Letta Median, MS, RD, LDN, CNSC Pager number available on Amion
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
## 2023-05-19 NOTE — Progress Notes (Signed)
   05/19/23 1630  Spiritual Encounters  Type of Visit Initial  Care provided to: Pt and family  Referral source Chaplain team  Reason for visit Advance directives  OnCall Visit No  Spiritual Framework  Presenting Themes Caregiving needs  Patient Stress Factors None identified  Family Stress Factors None identified  Interventions  Spiritual Care Interventions Made Reflective listening   Family wanted to have POA paperwork notarized. I explained what HCPOA is and is not. Family still wanted to complete HCPOA. Will need to follow-up Monday so the HCPOA will be notarized.
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
## 2023-05-19 NOTE — Progress Notes (Signed)
? ?  Inpatient Rehab Admissions Coordinator : ? ?Per therapy recommendations, patient was screened for CIR candidacy by Amelia Macken RN MSN.  At this time patient appears to be a potential candidate for CIR. I will place a rehab consult per protocol for full assessment. Please call me with any questions. ? ?Tamarra Geiselman RN MSN ?Admissions Coordinator ?[phone] ?  ?

## 2023-05-20 MED ORDER — DOCUSATE SODIUM 100 MG PO CAPS
100.0000 mg | ORAL_CAPSULE | Freq: Two times a day (BID) | ORAL | Status: DC
  Administered 2023-05-20 – 2023-05-21 (×3): 100 mg via ORAL
  Filled 2023-05-20 (×3): qty 1

## 2023-05-20 MED ORDER — METOPROLOL TARTRATE 12.5 MG HALF TABLET
12.5000 mg | ORAL_TABLET | Freq: Two times a day (BID) | ORAL | Status: DC
  Administered 2023-05-20 – 2023-05-23 (×6): 12.5 mg via ORAL
  Filled 2023-05-20 (×7): qty 1
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
## 2023-05-20 NOTE — Progress Notes (Signed)
Inpatient Rehab Admissions Coordinator:  Consult received. Pt's insurance is out of network. Another IPR will need to be pursued. TOC made aware. AC will sign off.   Wolfgang Phoenix, MS, CCC-SLP Admissions Coordinator [phone]

## 2023-05-21 MED ORDER — QUETIAPINE FUMARATE 25 MG PO TABS
75.0000 mg | ORAL_TABLET | Freq: Two times a day (BID) | ORAL | Status: DC
  Administered 2023-05-21 – 2023-05-23 (×4): 75 mg via ORAL
  Filled 2023-05-21 (×4): qty 3

## 2023-05-21 MED ORDER — SENNOSIDES-DOCUSATE SODIUM 8.6-50 MG PO TABS
1.0000 | ORAL_TABLET | Freq: Two times a day (BID) | ORAL | Status: DC
  Administered 2023-05-21 – 2023-05-23 (×4): 1 via ORAL
  Filled 2023-05-21 (×5): qty 1

## 2023-05-21 NOTE — Plan of Care (Signed)

## 2023-05-21 NOTE — Progress Notes (Signed)
Orthopedic Tech Progress Note Patient Details:  Paddy Panganiban Feb 24, 1986 [id number]  Ortho Devices Type of Ortho Device: Shoulder immobilizer Ortho Device/Splint Location: RUE Ortho Device/Splint Interventions: Ordered, Application, Adjustment   Post Interventions Patient Tolerated: Well Instructions Provided: Care of device, Adjustment of device  [place] A Gerilyn Pilgrim 05/21/2023, 10:57 AM
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
## 2023-05-21 NOTE — TOC CAGE-AID Note (Signed)
Transition of Care [facility]) - CAGE-AID Screening   Patient Details  Name: Sylvia Garcia MRN: [id number] Date of Birth: [birth year]  Transition of Care [facility]) CM/SW Contact:    Janora Norlander, RN Phone Number: [phone] 05/21/2023, 7:02 PM   Clinical Narrative: Pt originally was unable to be screened but today she requested resources for help with substance abuse (alcohol).  TRN gave resources to pt.    CAGE-AID Screening: Substance Abuse Screening unable to be completed due to: : Patient unable to participate                Substance abuse interventions: Educational Materials

## 2023-05-22 DIAGNOSIS — F4311 Post-traumatic stress disorder, acute: Secondary | ICD-10-CM | POA: Diagnosis not present

## 2023-05-22 LAB — CBC
HCT: 34.2 % — ABNORMAL LOW (ref 36.0–46.0)
Hemoglobin: 11.4 g/dL — ABNORMAL LOW (ref 12.0–15.0)
MCH: 29.1 pg (ref 26.0–34.0)
MCHC: 33.3 g/dL (ref 30.0–36.0)
MCV: 87.2 fL (ref 80.0–100.0)
Platelets: 479 10*3/uL — ABNORMAL HIGH (ref 150–400)
RBC: 3.92 MIL/uL (ref 3.87–5.11)
RDW: 13.3 % (ref 11.5–15.5)
WBC: 6.4 10*3/uL (ref 4.0–10.5)
nRBC: 0 % (ref 0.0–0.2)

## 2023-05-22 LAB — BASIC METABOLIC PANEL
Anion gap: 10 (ref 5–15)
BUN: 10 mg/dL (ref 6–20)
CO2: 24 mmol/L (ref 22–32)
Calcium: 8.8 mg/dL — ABNORMAL LOW (ref 8.9–10.3)
Chloride: 102 mmol/L (ref 98–111)
Creatinine, Ser: 0.66 mg/dL (ref 0.44–1.00)
GFR, Estimated: >60 mL/min (ref >60)
Glucose, Bld: 198 mg/dL — ABNORMAL HIGH (ref 70–99)
Potassium: 3.4 mmol/L — ABNORMAL LOW (ref 3.5–5.1)
Sodium: 136 mmol/L (ref 135–145)

## 2023-05-22 LAB — HCG, SERUM, QUALITATIVE: Preg, Serum: NEGATIVE

## 2023-05-22 LAB — MAGNESIUM: Magnesium: 1.6 mg/dL — ABNORMAL LOW (ref 1.7–2.4)

## 2023-05-22 MED ORDER — POTASSIUM CHLORIDE CRYS ER 20 MEQ PO TBCR
40.0000 meq | EXTENDED_RELEASE_TABLET | Freq: Once | ORAL | Status: AC
  Administered 2023-05-22: 40 meq via ORAL
  Filled 2023-05-22: qty 2

## 2023-05-22 MED ORDER — BOOST / RESOURCE BREEZE PO LIQD CUSTOM: 1.0000 | Freq: Three times a day (TID) | ORAL | Status: DC

## 2023-05-22 MED ORDER — MORPHINE SULFATE (PF) 2 MG/ML IV SOLN
2.0000 mg | INTRAVENOUS | Status: DC | PRN
  Administered 2023-05-22 (×2): 4 mg via INTRAVENOUS
  Filled 2023-05-22 (×2): qty 2

## 2023-05-22 MED ORDER — POLYETHYLENE GLYCOL 3350 17 G PO PACK
17.0000 g | PACK | Freq: Two times a day (BID) | ORAL | Status: DC
  Administered 2023-05-22: 17 g via ORAL
  Filled 2023-05-22 (×2): qty 1

## 2023-05-22 MED ORDER — MAGNESIUM SULFATE 2 GM/50ML IV SOLN
2.0000 g | Freq: Once | INTRAVENOUS | Status: AC
  Administered 2023-05-22: 2 g via INTRAVENOUS
  Filled 2023-05-22: qty 50

## 2023-05-22 NOTE — Plan of Care (Signed)
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
## 2023-05-22 NOTE — TOC Progression Note (Addendum)
Transition of Care [facility]) - Progression Note    Patient Details  Name: [name] MRN: [id number] Date of Birth: February 04, 1986  [facility] CM/SW Contact  Lorri Frederick, LCSW Phone Number: 05/22/2023, 10:27 AM  Clinical Narrative:   CSW spoke with pt regarding insurance being out of network with CIR.  Pt would like to pursue inpatient rehab at another facility.  [facility] would be first choice, would consider [facility] if cannot go to [facility].  Referral fax[facility] to [facility]/Pam.  Referral also sent through hub to [facility].  CSW spoke to [facility] who will review.    1440: CSW notes plan change as pt will now DC home.  [facility] and [facility] notified.        Barriers to Discharge: Continued Medical Work up  Expected Discharge Plan and Services In-house Referral: Clinical Social Work     Living arrangements for the past 2 months: Single Family Home                                       Social Determinants of Health (SDOH) Interventions SDOH Screenings   Tobacco Use: Medium Risk (01/04/2023)   [facility]    Readmission Risk Interventions     No data to display

## 2023-05-22 NOTE — TOC Progression Note (Signed)
Transition of Care [facility]) - Progression Note    Patient Details  Name: Sylvia Garcia MRN: [id number] Date of Birth: Mar 08, 1986  Transition of Care [facility]) CM/SW Contact  Ronny Bacon, RN Phone Number: 05/22/2023, 3:21 PM  Clinical Narrative: Spoke with patient and family at bedside. Discussed outpatient therapy options, patient and family decided on [facility]. Referral placed and information on AVS. Patient is expected to be discharged tomorrow. Patient and family decline Cane and 3:1, report already have equipment from other family members that she can use.       Barriers to Discharge: Continued Medical Work up  Expected Discharge Plan and Services In-house Referral: Clinical Social Work     Living arrangements for the past 2 months: Single Family Home Expected Discharge Date: 05/23/23                                     Social Determinants of Health (SDOH) Interventions SDOH Screenings   Tobacco Use: Medium Risk (01/04/2023)   [facility]    Readmission Risk Interventions     No data to display

## 2023-05-22 NOTE — Progress Notes (Signed)
   05/22/23 1705  Spiritual Encounters  Type of Visit Attempt (pt unavailable)  Reason for visit Advance directives  OnCall Visit No   Attempted to visit patient to collect AD, patient did not have forms, patient was also on a phone call. Waited 12 minutes as patient continued her call. Left patient and advised chaplain with return tomorrow.

## 2023-05-22 NOTE — Plan of Care (Signed)
   Problem: Education: Goal: Knowledge of General Education information will improve Description Including pain rating scale, medication(s)/side effects and non-pharmacologic comfort measures Outcome: Progressing   Problem: Activity: Goal: Risk for activity intolerance will decrease Outcome: Progressing   Problem: Coping: Goal: Level of anxiety will decrease Outcome: Progressing   Problem: Elimination: Goal: Will not experience complications related to bowel motility Outcome: Progressing   Problem: Pain Managment: Goal: General experience of comfort will improve Outcome: Progressing   
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
## 2023-05-23 ENCOUNTER — Other Ambulatory Visit (HOSPITAL_COMMUNITY): Payer: Self-pay

## 2023-05-23 LAB — CREATININE, SERUM
Creatinine, Ser: 0.62 mg/dL (ref 0.44–1.00)
GFR, Estimated: >60 mL/min (ref >60)

## 2023-05-23 MED ORDER — POLYETHYLENE GLYCOL 3350 17 GM/SCOOP PO POWD
17.0000 g | Freq: Two times a day (BID) | ORAL | 0 refills | Status: DC | PRN
  Filled 2023-05-23: qty 238, 7d supply, fill #0

## 2023-05-23 MED ORDER — CLONAZEPAM 0.5 MG PO TABS
0.5000 mg | ORAL_TABLET | Freq: Two times a day (BID) | ORAL | 0 refills | Status: DC | PRN
  Filled 2023-05-23: qty 10, 5d supply, fill #0

## 2023-05-23 MED ORDER — ACETAMINOPHEN 500 MG PO TABS: 1000.0000 mg | ORAL_TABLET | Freq: Three times a day (TID) | ORAL | Status: DC | PRN

## 2023-05-23 MED ORDER — METOPROLOL TARTRATE 25 MG PO TABS
12.5000 mg | ORAL_TABLET | Freq: Two times a day (BID) | ORAL | 0 refills | Status: DC
  Filled 2023-05-23: qty 30, 30d supply, fill #0

## 2023-05-23 MED ORDER — FOLIC ACID 1 MG PO TABS: 1.0000 mg | ORAL_TABLET | Freq: Every day | ORAL | Status: DC

## 2023-05-23 MED ORDER — THIAMINE HCL 100 MG PO TABS
100.0000 mg | ORAL_TABLET | Freq: Every day | ORAL | 0 refills | Status: DC
  Filled 2023-05-23: qty 30, 30d supply, fill #0

## 2023-05-23 MED ORDER — METHOCARBAMOL 500 MG PO TABS
1000.0000 mg | ORAL_TABLET | Freq: Three times a day (TID) | ORAL | 0 refills | Status: DC | PRN
  Filled 2023-05-23: qty 60, 10d supply, fill #0

## 2023-05-23 MED ORDER — OXYCODONE HCL 10 MG PO TABS
10.0000 mg | ORAL_TABLET | ORAL | 0 refills | Status: DC | PRN
  Filled 2023-05-23: qty 25, 5d supply, fill #0

## 2023-05-23 MED ORDER — ADULT MULTIVITAMIN W/MINERALS CH: 1.0000 | ORAL_TABLET | Freq: Every day | ORAL | Status: DC

## 2023-05-23 NOTE — Progress Notes (Signed)
    Durable Medical Equipment  (From admission, onward)           Start     Ordered   05/23/23 1032  For home use only DME Walker rolling  Once       Question Answer Comment  Walker: With 5 Inch Wheels   Patient needs a walker to treat with the following condition Fx      05/23/23 1032   05/23/23 1031  For home use only DME Bedside commode  Once       Question:  Patient needs a bedside commode to treat with the following condition  Answer:  Fx   05/23/23 1031
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
## 2023-05-23 NOTE — TOC Transition Note (Signed)
Transition of Care [facility]) - CM/SW Discharge Note   Patient Details  Name: Sylvia Garcia MRN: [id number] Date of Birth: 1986/08/11  [facility] CM/SW Contact:  Epifanio Lesches, RN Phone Number: 05/23/2023, 10:38 AM   Clinical Narrative:    Patient will DC to: home Anticipated DC date: 05/23/2023 Family notified: yes Transport by: car  Per MD patient ready for DC today pending therapy clearance .RN, patient, and patient's family notified of DC. Pt now agreeable to DME: RW and BSC. Referral made with Zach/ [facility]. Ian Malkin states [employer] not in network with insurance. Referral made with Jermaine/ [employer] , equipment will be delivered to bedside prior to d/c.  RX meds will be delivered to bedside prior to d/c. Post hospital f/u noted on AVS. Family to provide transportation to home.  RNCM will sign off for now as intervention is no longer needed. Please consult [place] again if new needs arise.   Final next level of care: Home/Self Care Barriers to Discharge: No Barriers Identified   Patient Goals and CMS Choice   Choice offered to / list presented to : Patient  Discharge Placement                         Discharge Plan and Services Additional resources added to the After Visit Summary for   In-house Referral: Clinical Social Work              DME Arranged: Bedside commode, [employer] fast streamer DME Agency: [employer] Date DME Agency Contacted: 05/23/23 Time DME Agency Contacted: 1037 Representative spoke with at DME Agency: Ian Malkin            Social Determinants of Health (SDOH) Interventions SDOH Screenings   Tobacco Use: Medium Risk (01/04/2023)   [facility]     Readmission Risk Interventions     No data to display

## 2023-05-23 NOTE — Discharge Instructions (Addendum)
Continue your hard collar until you follow up with Neurosurgery  Ortho is recommending weight bearing as tolerated for your right upper extremity

## 2023-05-31 ENCOUNTER — Emergency Department (HOSPITAL_COMMUNITY)
Admission: EM | Admit: 2023-05-31 | Discharge: 2023-05-31 | Disposition: A | Discharge status: Home / Self Care | Payer: BLUE CROSS/BLUE SHIELD | Attending: Emergency Medicine | Admitting: Emergency Medicine

## 2023-05-31 ENCOUNTER — Other Ambulatory Visit: Payer: Self-pay

## 2023-05-31 ENCOUNTER — Emergency Department (HOSPITAL_COMMUNITY): Payer: BLUE CROSS/BLUE SHIELD

## 2023-05-31 ENCOUNTER — Encounter (HOSPITAL_COMMUNITY): Payer: Self-pay

## 2023-05-31 DIAGNOSIS — D75839 Thrombocytosis, unspecified: Secondary | ICD-10-CM | POA: Diagnosis not present

## 2023-05-31 DIAGNOSIS — R799 Abnormal finding of blood chemistry, unspecified: Secondary | ICD-10-CM | POA: Diagnosis present

## 2023-05-31 LAB — COMPREHENSIVE METABOLIC PANEL
ALT: 41 U/L (ref 0–44)
AST: 30 U/L (ref 15–41)
Albumin: 3.9 g/dL (ref 3.5–5.0)
Alkaline Phosphatase: 115 U/L (ref 38–126)
Anion gap: 9 (ref 5–15)
BUN: 10 mg/dL (ref 6–20)
CO2: 23 mmol/L (ref 22–32)
Calcium: 9.5 mg/dL (ref 8.9–10.3)
Chloride: 104 mmol/L (ref 98–111)
Creatinine, Ser: 0.63 mg/dL (ref 0.44–1.00)
GFR, Estimated: >60 mL/min (ref >60)
Glucose, Bld: 92 mg/dL (ref 70–99)
Potassium: 3.8 mmol/L (ref 3.5–5.1)
Sodium: 136 mmol/L (ref 135–145)
Total Bilirubin: 0.4 mg/dL (ref 0.3–1.2)
Total Protein: 7.7 g/dL (ref 6.5–8.1)

## 2023-05-31 LAB — CBC WITH DIFFERENTIAL/PLATELET
Abs Immature Granulocytes: 0.03 10*3/uL (ref 0.00–0.07)
Basophils Absolute: 0.1 10*3/uL (ref 0.0–0.1)
Basophils Relative: 1 %
Eosinophils Absolute: 0.2 10*3/uL (ref 0.0–0.5)
Eosinophils Relative: 2 %
HCT: 35.7 % — ABNORMAL LOW (ref 36.0–46.0)
Hemoglobin: 12.1 g/dL (ref 12.0–15.0)
Immature Granulocytes: 0 %
Lymphocytes Relative: 27 %
Lymphs Abs: 2.4 10*3/uL (ref 0.7–4.0)
MCH: 30.3 pg (ref 26.0–34.0)
MCHC: 33.9 g/dL (ref 30.0–36.0)
MCV: 89.5 fL (ref 80.0–100.0)
Monocytes Absolute: 0.6 10*3/uL (ref 0.1–1.0)
Monocytes Relative: 6 %
Neutro Abs: 5.8 10*3/uL (ref 1.7–7.7)
Neutrophils Relative %: 64 %
Platelets: 1014 10*3/uL (ref 150–400)
RBC: 3.99 MIL/uL (ref 3.87–5.11)
RDW: 13.2 % (ref 11.5–15.5)
WBC: 9.1 10*3/uL (ref 4.0–10.5)
nRBC: 0 % (ref 0.0–0.2)

## 2023-05-31 LAB — HCG, SERUM, QUALITATIVE: Preg, Serum: NEGATIVE

## 2023-05-31 LAB — ACETAMINOPHEN LEVEL: Acetaminophen (Tylenol), Serum: <10 ug/mL — ABNORMAL LOW (ref 10–30)

## 2023-05-31 LAB — SALICYLATE LEVEL: Salicylate Lvl: <7 mg/dL — ABNORMAL LOW (ref 7.0–30.0)

## 2023-05-31 LAB — ETHANOL: Alcohol, Ethyl (B): <10 mg/dL (ref <10)

## 2023-05-31 MED ORDER — IOHEXOL 350 MG/ML SOLN
75.0000 mL | Freq: Once | INTRAVENOUS | Status: AC | PRN
  Administered 2023-05-31: 75 mL via INTRAVENOUS

## 2023-05-31 MED ORDER — HYDROCODONE-ACETAMINOPHEN 5-325 MG PO TABS
1.0000 | ORAL_TABLET | Freq: Once | ORAL | Status: AC
  Administered 2023-05-31: 1 via ORAL
  Filled 2023-05-31: qty 1

## 2023-05-31 MED ORDER — LORAZEPAM 2 MG/ML IJ SOLN: 1.0000 mg | Freq: Once | INTRAMUSCULAR | Status: DC

## 2023-05-31 MED ORDER — MORPHINE SULFATE (PF) 4 MG/ML IV SOLN
4.0000 mg | Freq: Once | INTRAVENOUS | Status: AC
  Administered 2023-05-31: 4 mg via INTRAVENOUS
  Filled 2023-05-31: qty 1

## 2023-05-31 MED ORDER — HYDROCODONE-ACETAMINOPHEN 5-325 MG PO TABS: 1.0000 | ORAL_TABLET | ORAL | 0 refills | Status: AC | PRN

## 2023-05-31 NOTE — Discharge Instructions (Addendum)
We discussed the results of your CT scan at length.  Have written a new prescription for pain medication, please take this as prescribed.  Please note that you can add Motrin to help with breakthrough pain.  You will need to schedule an appointment with Dr. Pamelia Hoit, his contact information is attached to your chart.
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
## 2023-05-31 NOTE — ED Notes (Addendum)
Date and time results received: 05/31/23 6:32 PM  Test: Platelet Critical Value: 1014  Name of Provider Notified: Pickering  Awaiting orders

## 2023-05-31 NOTE — ED Notes (Signed)
Called staffing for a sitter. Per staffing, no sitter available at this time.

## 2023-05-31 NOTE — ED Notes (Signed)
Pt endorsing intermittent SI in triage

## 2023-05-31 NOTE — ED Notes (Signed)
Pt isn't in 2B in triage. Pt isn't in lobby. Pt has eloped from the emergency room. Triage RN notified.
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
## 2023-05-31 NOTE — ED Notes (Signed)
Unable to collect blood. 
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
## 2023-06-06 ENCOUNTER — Telehealth: Payer: Self-pay | Admitting: Hematology and Oncology

## 2023-06-06 NOTE — Telephone Encounter (Signed)
Scheduled appointment per ed visiti. Patient is aware of appointment date and time. Patient is aware to arrive 15 mins prior to appointment time and to bring updated insurance cards. Patient is aware of location.

## 2023-06-10 ENCOUNTER — Inpatient Hospital Stay: Payer: BLUE CROSS/BLUE SHIELD | Attending: Adult Health | Admitting: Adult Health

## 2023-06-10 ENCOUNTER — Encounter: Payer: Self-pay | Admitting: Adult Health

## 2023-06-10 ENCOUNTER — Inpatient Hospital Stay: Payer: BLUE CROSS/BLUE SHIELD

## 2023-06-10 ENCOUNTER — Other Ambulatory Visit: Payer: Self-pay

## 2023-06-10 VITALS — BP 110/72 | HR 70 | Resp 18 | Ht 65.0 in | Wt 134.9 lb

## 2023-06-10 DIAGNOSIS — D75839 Thrombocytosis, unspecified: Secondary | ICD-10-CM | POA: Diagnosis present

## 2023-06-10 DIAGNOSIS — Z9049 Acquired absence of other specified parts of digestive tract: Secondary | ICD-10-CM | POA: Insufficient documentation

## 2023-06-10 DIAGNOSIS — Z87891 Personal history of nicotine dependence: Secondary | ICD-10-CM | POA: Insufficient documentation

## 2023-06-10 LAB — CMP (CANCER CENTER ONLY)
ALT: 25 U/L (ref 0–44)
AST: 22 U/L (ref 15–41)
Albumin: 4.5 g/dL (ref 3.5–5.0)
Alkaline Phosphatase: 121 U/L (ref 38–126)
Anion gap: 9 (ref 5–15)
BUN: 13 mg/dL (ref 6–20)
CO2: 28 mmol/L (ref 22–32)
Calcium: 9.4 mg/dL (ref 8.9–10.3)
Chloride: 103 mmol/L (ref 98–111)
Creatinine: 0.62 mg/dL (ref 0.44–1.00)
GFR, Estimated: >60 mL/min (ref >60)
Glucose, Bld: 82 mg/dL (ref 70–99)
Potassium: 3.4 mmol/L — ABNORMAL LOW (ref 3.5–5.1)
Sodium: 140 mmol/L (ref 135–145)
Total Bilirubin: 0.3 mg/dL (ref 0.3–1.2)
Total Protein: 7.8 g/dL (ref 6.5–8.1)

## 2023-06-10 LAB — CBC WITH DIFFERENTIAL (CANCER CENTER ONLY)
Abs Immature Granulocytes: 0.02 10*3/uL (ref 0.00–0.07)
Basophils Absolute: 0 10*3/uL (ref 0.0–0.1)
Basophils Relative: 1 %
Eosinophils Absolute: 0.2 10*3/uL (ref 0.0–0.5)
Eosinophils Relative: 2 %
HCT: 37.7 % (ref 36.0–46.0)
Hemoglobin: 12.8 g/dL (ref 12.0–15.0)
Immature Granulocytes: 0 %
Lymphocytes Relative: 37 %
Lymphs Abs: 2.7 10*3/uL (ref 0.7–4.0)
MCH: 30.3 pg (ref 26.0–34.0)
MCHC: 34 g/dL (ref 30.0–36.0)
MCV: 89.3 fL (ref 80.0–100.0)
Monocytes Absolute: 0.5 10*3/uL (ref 0.1–1.0)
Monocytes Relative: 7 %
Neutro Abs: 3.9 10*3/uL (ref 1.7–7.7)
Neutrophils Relative %: 53 %
Platelet Count: 566 10*3/uL — ABNORMAL HIGH (ref 150–400)
RBC: 4.22 MIL/uL (ref 3.87–5.11)
RDW: 13.2 % (ref 11.5–15.5)
WBC Count: 7.4 10*3/uL (ref 4.0–10.5)
nRBC: 0 % (ref 0.0–0.2)

## 2023-06-10 LAB — SAVE SMEAR(SSMR), FOR PROVIDER SLIDE REVIEW

## 2023-06-10 LAB — RETIC PANEL
Immature Retic Fract: 16.3 % — ABNORMAL HIGH (ref 2.3–15.9)
RBC.: 4.3 MIL/uL (ref 3.87–5.11)
Retic Count, Absolute: 73.5 10*3/uL (ref 19.0–186.0)
Retic Ct Pct: 1.7 % (ref 0.4–3.1)
Reticulocyte Hemoglobin: 33.8 pg (ref >27.9)

## 2023-06-10 LAB — IRON AND IRON BINDING CAPACITY (CC-WL,HP ONLY)
Iron: 71 ug/dL (ref 28–170)
Saturation Ratios: 18 % (ref 10.4–31.8)
TIBC: 391 ug/dL (ref 250–450)
UIBC: 320 ug/dL (ref 148–442)

## 2023-06-10 LAB — FERRITIN: Ferritin: 101 ng/mL (ref 11–307)
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
## 2023-09-12 ENCOUNTER — Telehealth: Payer: Self-pay | Admitting: Hematology

## 2023-09-14 ENCOUNTER — Inpatient Hospital Stay: Payer: BLUE CROSS/BLUE SHIELD | Admitting: Hematology

## 2023-09-21 ENCOUNTER — Inpatient Hospital Stay: Payer: BLUE CROSS/BLUE SHIELD

## 2023-09-21 ENCOUNTER — Inpatient Hospital Stay: Payer: BLUE CROSS/BLUE SHIELD | Attending: Adult Health | Admitting: Hematology
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
